# Patient Record
Sex: Female | Born: 1995 | Race: Black or African American | Hispanic: No | Marital: Married | Smoking: Never smoker
Health system: Southern US, Community
[De-identification: ages and names within clinical notes are randomized; demographics above are authoritative.]

## PROBLEM LIST (undated history)

## (undated) DIAGNOSIS — D509 Iron deficiency anemia, unspecified: Secondary | ICD-10-CM

## (undated) DIAGNOSIS — Z789 Other specified health status: Secondary | ICD-10-CM

## (undated) HISTORY — DX: Other specified health status: Z78.9

## (undated) HISTORY — DX: Iron deficiency anemia, unspecified: D50.9

---

## 2021-02-23 ENCOUNTER — Encounter: Payer: Self-pay | Admitting: Nurse Practitioner

## 2021-02-23 ENCOUNTER — Other Ambulatory Visit: Payer: Self-pay

## 2021-02-23 ENCOUNTER — Ambulatory Visit: Payer: Medicaid Other | Admitting: Nurse Practitioner

## 2021-02-23 VITALS — BP 106/60 | HR 97 | Temp 97.6°F | Ht 65.0 in | Wt 141.2 lb

## 2021-02-23 DIAGNOSIS — Z3402 Encounter for supervision of normal first pregnancy, second trimester: Secondary | ICD-10-CM | POA: Diagnosis not present

## 2021-02-23 DIAGNOSIS — O0930 Supervision of pregnancy with insufficient antenatal care, unspecified trimester: Secondary | ICD-10-CM

## 2021-02-23 DIAGNOSIS — Z3400 Encounter for supervision of normal first pregnancy, unspecified trimester: Secondary | ICD-10-CM | POA: Insufficient documentation

## 2021-02-23 DIAGNOSIS — Z23 Encounter for immunization: Secondary | ICD-10-CM | POA: Diagnosis not present

## 2021-02-23 DIAGNOSIS — O99012 Anemia complicating pregnancy, second trimester: Secondary | ICD-10-CM

## 2021-02-23 DIAGNOSIS — B379 Candidiasis, unspecified: Secondary | ICD-10-CM

## 2021-02-23 DIAGNOSIS — O0932 Supervision of pregnancy with insufficient antenatal care, second trimester: Secondary | ICD-10-CM

## 2021-02-23 DIAGNOSIS — R011 Cardiac murmur, unspecified: Secondary | ICD-10-CM

## 2021-02-23 DIAGNOSIS — D649 Anemia, unspecified: Secondary | ICD-10-CM

## 2021-02-23 HISTORY — DX: Anemia, unspecified: D64.9

## 2021-02-23 HISTORY — DX: Cardiac murmur, unspecified: R01.1

## 2021-02-23 LAB — URINALYSIS
Bilirubin, UA: NEGATIVE
Glucose, UA: NEGATIVE
Ketones, UA: NEGATIVE
Nitrite, UA: NEGATIVE
Protein,UA: NEGATIVE
RBC, UA: NEGATIVE
Specific Gravity, UA: 1.025 (ref 1.005–1.030)
Urobilinogen, Ur: 1 mg/dL (ref 0.2–1.0)
pH, UA: 6 (ref 5.0–7.5)

## 2021-02-23 LAB — WET PREP FOR TRICH, YEAST, CLUE: Trichomonas Exam: NEGATIVE

## 2021-02-23 LAB — HEMOGLOBIN, FINGERSTICK: Hemoglobin: 5.5 g/dL — CL (ref 11.1–15.9)

## 2021-02-23 MED ORDER — CLOTRIMAZOLE 1 % VA CREA: 1.0000 | TOPICAL_CREAM | Freq: Every day | VAGINAL | 0 refills | Status: AC

## 2021-02-23 MED ORDER — PRENATAL VITAMIN 27-0.8 MG PO TABS: 1.0000 | ORAL_TABLET | Freq: Every day | ORAL | 0 refills | Status: AC

## 2021-02-23 MED ORDER — CLOTRIMAZOLE 3 2 % VA CREA: 1.0000 | TOPICAL_CREAM | Freq: Every day | VAGINAL | 0 refills | Status: DC

## 2021-02-23 NOTE — Progress Notes (Signed)
[facility]   INITIAL PRENATAL VISIT NOTE  Subjective:  Rachel Simon is a 26 y.o. G1P0 at [redacted]w[redacted]d being seen today to start prenatal care at the [facility] Department.  She is currently monitored for the following issues for this high-risk pregnancy and has Supervision of normal first pregnancy, antepartum; Anemia; and Heart murmur on their problem list.  Patient reports no complaints.  Contractions: Not present. Vag. Bleeding: None.  Movement: Present. Denies leaking of fluid.   Indications for ASA therapy (per uptodate) One of the following: Previous pregnancy with preeclampsia, especially early onset and with an adverse outcome No Multifetal gestation No Chronic hypertension No Type 1 or 2 diabetes mellitus No Chronic kidney disease No Autoimmune disease (antiphospholipid syndrome, systemic lupus erythematosus) No  Two or more of the following: Nulliparity Yes Obesity (body mass index >30 kg/m2) No Family history of preeclampsia in mother or sister No Age ?35 years No Sociodemographic characteristics (African American race, low socioeconomic level) Yes Personal risk factors (eg, previous pregnancy with low birth weight or small for gestational age infant, previous adverse pregnancy outcome [eg, stillbirth], interval >10 years between pregnancies) No   The following portions of the patient's history were reviewed and updated as appropriate: allergies, current medications, past family history, past medical history, past social history, past surgical history and problem list. Problem list updated.  Objective:   Vitals:   02/23/21 1343  BP: 106/60  Pulse: 97  Temp: 97.6 F (36.4 C)  Weight: 141 lb 3.2 oz (64 kg)  Height: 5\' 5"  (1.651 m)    Fetal Status: Fetal Heart Rate (bpm): 150 Fundal Height: 24 cm Movement: Present  Presentation: Undeterminable   Physical Exam Vitals and nursing note reviewed.   Constitutional:      General: She is not in acute distress.    Appearance: Normal appearance. She is well-developed.  HENT:     Head: Normocephalic and atraumatic.     Right Ear: External ear normal.     Left Ear: External ear normal.     Nose: Nose normal. No congestion or rhinorrhea.     Mouth/Throat:     Lips: Pink.     Mouth: Mucous membranes are moist.     Dentition: Normal dentition. No dental caries.     Pharynx: Oropharynx is clear. Uvula midline.     Comments: Dentition: No visible signs of dental caries.  Last dental exam 1 year ago.  Eyes:     General: No scleral icterus.    Conjunctiva/sclera: Conjunctivae normal.     Pupils: Pupils are equal, round, and reactive to light.  Neck:     Thyroid: No thyroid mass or thyromegaly.  Cardiovascular:     Rate and Rhythm: Normal rate.     Pulses: Normal pulses.     Heart sounds: Murmur heard.     Comments: Extremities are warm and well perfused Grade 3, blowing, heard loudest in the left and right second intercostal space.  Pulmonary:     Effort: Pulmonary effort is normal.     Breath sounds: Normal breath sounds.  Chest:     Chest wall: No mass.  Breasts:    Tanner Score is 5.     Breasts are symmetrical.     Right: Normal. No mass, nipple discharge or skin change.     Left: Normal. No mass, nipple discharge or skin change.     Comments: Breasts:  Right: Normal. No swelling, mass, nipple discharge, skin change or tenderness.        Left: Normal. No swelling, mass, nipple discharge, skin change or tenderness.   Abdominal:     General: Abdomen is flat.     Palpations: Abdomen is soft.     Tenderness: There is no abdominal tenderness.     Comments: Gravid   Genitourinary:    General: Normal vulva.     Exam position: Lithotomy position.     Pubic Area: No rash.      Labia:        Right: No rash.        Left: No rash.      Vagina: Normal. No vaginal discharge.     Cervix: No cervical motion tenderness or  friability.     Uterus: Normal. Enlarged (Gravid 24 size). Not tender.      Adnexa: Right adnexa normal and left adnexa normal.     Rectum: Normal. No external hemorrhoid.     Comments: External genitalia/pubic area without nits, lice, edema, erythema, lesions and inguinal adenopathy. Vagina with normal mucosa and discharge. Cervix without visible lesions. Uterus firm, mobile, nt, no masses, no CMT, no adnexal tenderness or fullness. Yeast noted.   Musculoskeletal:     Cervical back: Full passive range of motion without pain, normal range of motion and neck supple.     Right lower leg: No edema.     Left lower leg: No edema.  Lymphadenopathy:     Cervical: No cervical adenopathy.     Upper Body:     Right upper body: No axillary adenopathy.     Left upper body: No axillary adenopathy.  Skin:    General: Skin is warm and dry.  Neurological:     Mental Status: She is alert and oriented to person, place, and time.  Psychiatric:        Attention and Perception: Attention normal.        Mood and Affect: Mood normal.        Speech: Speech normal.        Behavior: Behavior is cooperative.    Assessment and Plan:  Pregnancy: G1P0 at [redacted]w[redacted]d  1. Supervision of normal first pregnancy, antepartum - 26 year old female in clinic today for a prenatal visit.  -Patient not currently not taking PNV.  Will provide PNV today for patient.  -- Dating: has sure LMP, dating [place] not indicated. - Genetic screening: exceeded gestation age for genetic screening.  - Pregnancy sx: Denies N/V- counseling given if it developed  -  Reviewed safety of routine dental care in pregnancy.  - Has support system that is involved  - Routine labs to determine anemia status given report  - Vaccinations: Flu vaccine given today.  Patient reports never having a COVID vaccine.  - Has two minor risk factors for preeclampsia. Due to gestation age, no benefits in starting ASA at this time.   -PHQ-9-4  - Urine Culture -  Chlamydia/GC NAA, Confirmation - Lead, blood (adult age 53 yrs or greater) - HCV Ab w Reflex to Quant PCR - Hgb Fractionation Cascade - HIV-1/HIV-2 Qualitative RNA - Prenatal profile without Varicella or Rubella - QuantiFERON-TB Gold Plus - Prenatal Vit-Fe Fumarate-FA (PRENATAL VITAMIN) 27-0.8 MG TABS; Take 1 tablet by mouth daily at 6 (six) AM.  Dispense: 100 tablet; Refill: 0 - WET PREP FOR TRICH, YEAST, CLUE - IGP, rfx Aptima HPV ASCU - Hemoglobin, venipuncture - Urinalysis (Urine Dip) -  AMB Referral to [facility] - Ambulatory referral to Hematology / Oncology  2. Anemia, unspecified type -Hemoglobin obtained today= 5.5.  Referral made to hematology oncology today. Encouraged iron rich foods.  - Fe+CBC/D/Plt+TIBC+Fer+Retic  3. Heart murmur -Heart murmur detected on physical exam.   -Grade 3, blowing, heard loudest in the left and right second intercostal space.  -Referral made to [facility].    4. Late prenatal care -Late entry to care at 24 weeks.     5. Yeast detected -Wet mount reviewed.  Please treat patient for yeast.  - clotrimazole (CLOTRIMAZOLE-7) 1 % vaginal cream; Place 1 Applicatorful vaginally at bedtime for 7 days.  Dispense: 45 g; Refill: 0  Language line used for the provider portion of the visit.  ID # [id number].  Discussed overview of care and coordination with inpatient delivery practices including [facility], [facility], [facility] and [facility].      Term labor symptoms and general obstetric precautions including but not limited to vaginal bleeding, contractions, leaking of fluid and fetal movement were reviewed in detail with the patient.  Please refer to After Visit Summary for other counseling recommendations.      Return in about 4 weeks (around 03/23/2021) for Routine prenatal care.   Gregary Cromer, FNP

## 2021-02-23 NOTE — Progress Notes (Signed)
Presents for initiation of prenatal care. Denies receiving any prenatal care thus far in her pregnancy and has not been taking PNV.PNV dispensed. Client was born in the [place] and in the [place] x6 years. Jossie Ng, RN Client's husband signed Tour manager form in the event he needs to assist in interpretation. Jossie Ng, RN Dr. Alvester Morin and Glenna Fellows FNP notified of [facility] lab hgb = 5.5 and repeat of 5.7. Per Ms. White, no oral iron supplement at this time. [facility] e-referral done. Wet prep with moderate yeast and treated per standing order. Urine dip reviewed and with 3= leukocytes. Urine culture ordered as part of new OB labs. Jossie Ng, RN Client provided copy of [facility] map with [employer] [facility] [facility] ([place]) noted. Address for Cardiology at [facility] in [place] provided to client. Also provided [facility]. Per client, they have no car and are unable to get to appts (walked to [facility] appt today). Per Dr. Alvester Morin, contact R. Marlan Palau MSW-OBCM for transpo assistance as client has presumptive Medicaid. Per client, entered [place] 6 years ago as refugees. Counseled to contact DSS to determine if eligible for Medicaid benefits. Jossie Ng, RN

## 2021-02-24 ENCOUNTER — Encounter: Payer: Self-pay | Admitting: *Deleted

## 2021-02-24 LAB — FE+CBC/D/PLT+TIBC+FER+RETIC
Basophils Absolute: 0 10*3/uL (ref 0.0–0.2)
Basos: 0 %
EOS (ABSOLUTE): 0 10*3/uL (ref 0.0–0.4)
Eos: 0 %
Ferritin: 7 ng/mL — ABNORMAL LOW (ref 15–150)
Hematocrit: 19.1 % — ABNORMAL LOW (ref 34.0–46.6)
Hemoglobin: 5.7 g/dL — CL (ref 11.1–15.9)
Immature Grans (Abs): 0.1 10*3/uL (ref 0.0–0.1)
Immature Granulocytes: 1 %
Iron Saturation: 3 % — CL (ref 15–55)
Iron: 18 ug/dL — ABNORMAL LOW (ref 27–159)
Lymphocytes Absolute: 1.1 10*3/uL (ref 0.7–3.1)
Lymphs: 13 %
MCH: 20.1 pg — ABNORMAL LOW (ref 26.6–33.0)
MCHC: 29.8 g/dL — ABNORMAL LOW (ref 31.5–35.7)
MCV: 68 fL — ABNORMAL LOW (ref 79–97)
Monocytes Absolute: 0.5 10*3/uL (ref 0.1–0.9)
Monocytes: 6 %
Neutrophils Absolute: 6.7 10*3/uL (ref 1.4–7.0)
Neutrophils: 80 %
Platelets: 300 10*3/uL (ref 150–450)
RBC: 2.83 x10E6/uL — ABNORMAL LOW (ref 3.77–5.28)
RDW: 17.6 % — ABNORMAL HIGH (ref 11.7–15.4)
Retic Ct Pct: 2 % (ref 0.6–2.6)
Total Iron Binding Capacity: 543 ug/dL — ABNORMAL HIGH (ref 250–450)
UIBC: 525 ug/dL — ABNORMAL HIGH (ref 131–425)
WBC: 8.5 10*3/uL (ref 3.4–10.8)

## 2021-02-24 LAB — CBC/D/PLT+RPR+RH+ABO+AB SCR
Antibody Screen: NEGATIVE
Hepatitis B Surface Ag: NEGATIVE
RPR Ser Ql: NONREACTIVE
Rh Factor: POSITIVE

## 2021-02-24 LAB — HCV AB W REFLEX TO QUANT PCR: HCV Ab: <0.1 s/co ratio (ref 0.0–0.9)

## 2021-02-24 LAB — LEAD, BLOOD (ADULT >= 16 YRS): Lead-Whole Blood: <1 ug/dL (ref 0.0–3.4)

## 2021-02-24 LAB — HCV INTERPRETATION

## 2021-02-26 LAB — CHLAMYDIA/GC NAA, CONFIRMATION
Chlamydia trachomatis, NAA: NEGATIVE
Neisseria gonorrhoeae, NAA: NEGATIVE

## 2021-02-27 ENCOUNTER — Other Ambulatory Visit: Payer: Self-pay | Admitting: Nurse Practitioner

## 2021-02-27 DIAGNOSIS — Z3689 Encounter for other specified antenatal screening: Secondary | ICD-10-CM

## 2021-02-27 DIAGNOSIS — O99019 Anemia complicating pregnancy, unspecified trimester: Secondary | ICD-10-CM | POA: Insufficient documentation

## 2021-02-27 LAB — IGP, RFX APTIMA HPV ASCU: PAP Smear Comment: 0

## 2021-02-27 NOTE — Progress Notes (Deleted)
°  [facility]  [phone] [phone]  ID: Rachel Simon OB: 17-Nov-1995  MR#: [id number]  SVX#:[id number]  Patient Care Team: Pcp, No as PCP - General Orlie Dakin Tollie Pizza, MD as Consulting Physician (Hematology)  CHIEF COMPLAINT: Anemia affecting pregnancy.  INTERVAL HISTORY: ***  REVIEW OF SYSTEMS:   ROS  As per HPI. Otherwise, a complete review of systems is negative.  PAST MEDICAL HISTORY: Past Medical History:  Diagnosis Date   Anemia 02/23/2021   Heart murmur 02/23/2021   IDA (iron deficiency anemia)     PAST SURGICAL HISTORY: No past surgical history on file.  FAMILY HISTORY: Family History  Problem Relation Age of Onset   GI problems Father     ADVANCED DIRECTIVES (Y/N):  N  HEALTH MAINTENANCE: Social History   Tobacco Use   Smoking status: Never    Passive exposure: Never   Smokeless tobacco: Never  Vaping Use   Vaping Use: Never used  Substance Use Topics   Alcohol use: Never   Drug use: Never     Colonoscopy:  PAP:  Bone density:  Lipid panel:  No Known Allergies  Current Outpatient Medications  Medication Sig Dispense Refill   clotrimazole (CLOTRIMAZOLE-7) 1 % vaginal cream Place 1 Applicatorful vaginally at bedtime for 7 days. 45 g 0   Prenatal Vit-Fe Fumarate-FA (PRENATAL VITAMIN) 27-0.8 MG TABS Take 1 tablet by mouth daily at 6 (six) AM. 100 tablet 0   No current facility-administered medications for this visit.    OBJECTIVE: There were no vitals filed for this visit.   There is no height or weight on file to calculate BMI.    ECOG FS:{CHL ONC [id number]  General: Well-developed, well-nourished, no acute distress. Eyes: Pink conjunctiva, anicteric sclera. HEENT: Normocephalic, moist mucous membranes. Lungs: No audible wheezing or coughing. Heart: Regular rate and rhythm. Abdomen: Soft, nontender, no obvious distention. Musculoskeletal: No edema, cyanosis, or clubbing. Neuro:  Alert, answering all questions appropriately. Cranial nerves grossly intact. Skin: No rashes or petechiae noted. Psych: Normal affect. Lymphatics: No cervical, calvicular, axillary or inguinal LAD.   LAB RESULTS:  No results found for: NA, K, CL, CO2, GLUCOSE, BUN, CREATININE, CALCIUM, PROT, ALBUMIN, AST, ALT, ALKPHOS, BILITOT, GFRNONAA, GFRAA  Lab Results  Component Value Date   WBC 8.5 02/23/2021   NEUTROABS 6.7 02/23/2021   HGB 5.7 (LL) 02/23/2021   HCT 19.1 (L) 02/23/2021   MCV 68 (L) 02/23/2021   PLT 300 02/23/2021     STUDIES: No results found.  ASSESSMENT: Anemia affecting pregnancy.  PLAN:    Anemia affecting pregnancy:   Patient expressed understanding and was in agreement with this plan. She also understands that She can call clinic at any time with any questions, concerns, or complaints.    Cancer Staging  No matching staging information was found for the patient.  Jeralyn Ruths, MD   02/27/2021 8:59 AM

## 2021-02-28 ENCOUNTER — Inpatient Hospital Stay: Payer: Medicaid Other

## 2021-02-28 ENCOUNTER — Encounter: Payer: Self-pay | Admitting: Cardiology

## 2021-02-28 ENCOUNTER — Inpatient Hospital Stay: Payer: Medicaid Other | Admitting: Oncology

## 2021-02-28 ENCOUNTER — Telehealth: Payer: Self-pay | Admitting: Licensed Clinical Social Worker

## 2021-02-28 ENCOUNTER — Telehealth: Payer: Self-pay

## 2021-02-28 ENCOUNTER — Ambulatory Visit (INDEPENDENT_AMBULATORY_CARE_PROVIDER_SITE_OTHER): Payer: Medicaid Other | Admitting: Cardiology

## 2021-02-28 ENCOUNTER — Other Ambulatory Visit: Payer: Self-pay

## 2021-02-28 VITALS — BP 118/60 | HR 74 | Ht 65.0 in | Wt 146.8 lb

## 2021-02-28 DIAGNOSIS — I38 Endocarditis, valve unspecified: Secondary | ICD-10-CM | POA: Insufficient documentation

## 2021-02-28 DIAGNOSIS — Z719 Counseling, unspecified: Secondary | ICD-10-CM | POA: Diagnosis not present

## 2021-02-28 DIAGNOSIS — O99019 Anemia complicating pregnancy, unspecified trimester: Secondary | ICD-10-CM

## 2021-02-28 LAB — HGB FRACTIONATION CASCADE
Hgb A2: 1.9 % (ref 1.8–3.2)
Hgb A: 98.1 % (ref 96.4–98.8)
Hgb F: 0 % (ref 0.0–2.0)
Hgb S: 0 %

## 2021-02-28 LAB — QUANTIFERON-TB GOLD PLUS
QuantiFERON Mitogen Value: 0.86 IU/mL
QuantiFERON Nil Value: 0.02 IU/mL
QuantiFERON TB1 Ag Value: 0.02 IU/mL
QuantiFERON TB2 Ag Value: 0.02 IU/mL
QuantiFERON-TB Gold Plus: NEGATIVE

## 2021-02-28 LAB — HIV-1/HIV-2 QUALITATIVE RNA
HIV-1 RNA, Qualitative: NONREACTIVE
HIV-2 RNA, Qualitative: NONREACTIVE

## 2021-02-28 NOTE — Telephone Encounter (Signed)
Client kept cardiology appt this am in [place].   [facility] at [facility] for evaluation of anemia due to mix up with transportation. Per Efraim Kaufmann at [facility], client has new appt on 03/02/2021 at 1000 and Zenaida Niece will pick her up (arranged by Cancer Center and no charge to client) at 0900. Client must be outside waiting for her transportation.  Client needs [facility] RV appt at [facility] and scheduled for 03/23/2021 with arrival time of 1100 (will try to be here by 1045).  Client notified of [facility] ([place]) [place] appt on 03/30/2021 with arrival time of 1245 and aware RN will contact R. Marlan Palau MSW, OBCM for assistance with transportation.  Client declined offer of Swahili interpreter and able to converse with RN in [place]. Client verbalized all of above appt dates and times and able to correctly verbalize to be waiting outside at 9:00 for ride to [facility] appt. Jossie Ng, RN

## 2021-02-28 NOTE — Telephone Encounter (Signed)
TC to patient with [employer] with [place] ID# [phone]. Patient has [facility] U/S appointment on 03/30/21 at 1:00. Patient also needs to schedule next MH RV. LM with number to call.Burt Knack, RN

## 2021-02-28 NOTE — Telephone Encounter (Addendum)
LCSW assisted pt with transportation home today through [place] as having challenges w/ getting connected with her Marjean Donna ride due to language barrier and office layout. LCSW also updated [facility] hematology-oncology office [facility]) that she will be delayed coming to appt today due to challenges w/ transportation home.   Octavio Graves, MSW, LCSW Clinical Social Worker II [facility] Navigation  [phone]- work cell phone (preferred) [phone]- desk phone

## 2021-02-28 NOTE — Patient Instructions (Addendum)
Medication Instructions:  Your physician recommends that you continue on your current medications as directed. Please refer to the Current Medication list given to you today.  *If you need a refill on your cardiac medications before your next appointment, please call your pharmacy*   Lab Work: None If you have labs (blood work) drawn today and your tests are completely normal, you will receive your results only by: MyChart Message (if you have MyChart) OR A paper copy in the mail If you have any lab test that is abnormal or we need to change your treatment, we will call you to review the results.   Testing/Procedures: Your physician has requested that you have an echocardiogram. Echocardiography is a painless test that uses sound waves to create images of your heart. It provides your doctor with information about the size and shape of your heart and how well your hearts chambers and valves are working. This procedure takes approximately one hour. There are no restrictions for this procedure.    Follow-Up: At [facility], you and your health needs are our priority.  As part of our continuing mission to provide you with exceptional heart care, we have created designated Provider Care Teams.  These Care Teams include your primary Cardiologist (physician) and Advanced Practice Providers (APPs -  Physician Assistants and Nurse Practitioners) who all work together to provide you with the care you need, when you need it.  We recommend signing up for the patient portal called "MyChart".  Sign up information is provided on this After Visit Summary.  MyChart is used to connect with patients for Virtual Visits (Telemedicine).  Patients are able to view lab/test results, encounter notes, upcoming appointments, etc.  Non-urgent messages can be sent to your provider as well.   To learn more about what you can do with MyChart, go to [email or web address].    Your next appointment:   8 week(s)  The  format for your next appointment:   In Person  Provider:   Thomasene Ripple, DO [facility] [address] [place], [place] [address]     Other Instructions

## 2021-02-28 NOTE — Progress Notes (Signed)
Cardio-Obstetrics Clinic  New Evaluation  Date:  02/28/2021   ID:  Rachel Simon, Rachel Simon, 1997, MRN [id number]  PCP:  Pcp, No   [facility] Providers Cardiologist:  Thomasene Ripple, DO  Electrophysiologist:  None       Referring MD: Federico Flake,*   Chief Complaint: " I am not sure my doctor ask me to come see you"  History of Present Illness:    Rachel Simon is a 26 y.o. female [G1P0] who is being seen today for the evaluation of heart murmur at the request of Federico Flake,*.    Prior CV Studies Reviewed: The following studies were reviewed today:   Past Medical History:  Diagnosis Date   Anemia 02/23/2021   Heart murmur 02/23/2021   IDA (iron deficiency anemia)     No past surgical history on file.    OB History     Gravida  1   Rachel      Term      Preterm      AB      Living         SAB      IAB      Ectopic      Multiple      Live Births                  Current Medications: Current Meds  Medication Sig   clotrimazole (CLOTRIMAZOLE-7) 1 % vaginal cream Place 1 Applicatorful vaginally at bedtime for 7 days.   Prenatal Vit-Fe Fumarate-FA (PRENATAL VITAMIN) 27-0.8 MG TABS Take 1 tablet by mouth daily at 6 (six) AM.     Allergies:   Patient has no known allergies.   Social History   Socioeconomic History   Marital status: Married    Spouse name: Mfaume Lumona   Number of children: 0   Years of education: 12   Highest education level: High school graduate  Occupational History   Occupation: homemaker  Tobacco Use   Smoking status: Never    Passive exposure: Never   Smokeless tobacco: Never  Vaping Use   Vaping Use: Never used  Substance and Sexual Activity   Alcohol use: Never   Drug use: Never   Sexual activity: Yes    Birth control/protection: None  Other Topics Concern   Not on file  Social History Narrative   Lives with spouse.   Social Determinants of Health   Financial  Resource Strain: Medium Risk   Difficulty of Paying Living Expenses: Somewhat hard  Food Insecurity: Geophysicist/field seismologist Present   Worried About Programme researcher, broadcasting/film/video in the Last Year: Sometimes true   Barista in the Last Year: Sometimes true  Transportation Needs: Unmet Transportation Needs   Lack of Transportation (Medical): Yes   Lack of Transportation (Non-Medical): Yes  Physical Activity: Not on file  Stress: Not on file  Social Connections: Not on file      Family History  Problem Relation Age of Onset   GI problems Father       ROS:   Please see the history of present illness.     All other systems reviewed and are negative.   Labs/EKG Reviewed:    EKG:   EKG was ordered today.  The ekg ordered today demonstrates sinus rhythm, heart rate 74 bpm.  Recent Labs: 02/23/2021: Hemoglobin 5.7; Platelets 300   Recent Lipid Panel No results found for: CHOL, TRIG, HDL, CHOLHDL, LDLCALC, LDLDIRECT  Physical Exam:    VS:  BP 118/60 (BP Location: Left Arm, Patient Position: Sitting)    Pulse 74    Ht 5\' 5"  (1.651 m)    Wt 146 lb 12.8 oz (66.6 kg)    LMP Simon/15/2022 (Exact Date)    SpO2 99%    BMI 24.43 kg/m     Wt Readings from Last 3 Encounters:  02/28/21 146 lb 12.8 oz (66.6 kg)  02/23/21 141 lb 3.2 oz (64 kg)     GEN:  Well nourished, well developed in no acute distress HEENT: Normal NECK: No JVD; No carotid bruits LYMPHATICS: No lymphadenopathy CARDIAC: RRR, 2/6 diastolic  murmurs, rubs, gallops RESPIRATORY:  Clear to auscultation without rales, wheezing or rhonchi  ABDOMEN: Soft, non-tender, non-distended MUSCULOSKELETAL:  No edema; No deformity  SKIN: Warm and dry NEUROLOGIC:  Alert and oriented x 3 PSYCHIATRIC:  Normal affect    Risk Assessment/Risk Calculators:     CARPREG II Risk Prediction Index Score:  1.  The patient's risk for a primary cardiac event is 5%.            ASSESSMENT & PLAN:    Diastolic heart murmur  Diastolic murmurs were  not part of physiologic pregnancy murmurs.  Therefore with the evidence of diastolic murmur we will get an echocardiogram to rule out any valvular abnormalities.   Patient Instructions  Medication Instructions:  Your physician recommends that you continue on your current medications as directed. Please refer to the Current Medication list given to you today.  *If you need a refill on your cardiac medications before your next appointment, please call your pharmacy*   Lab Work: None If you have labs (blood work) drawn today and your tests are completely normal, you will receive your results only by: MyChart Message (if you have MyChart) OR A paper copy in the mail If you have any lab test that is abnormal or we need to change your treatment, we will call you to review the results.   Testing/Procedures: Your physician has requested that you have an echocardiogram. Echocardiography is a painless test that uses sound waves to create images of your heart. It provides your doctor with information about the size and shape of your heart and how well your hearts chambers and valves are working. This procedure takes approximately one hour. There are no restrictions for this procedure.    Follow-Up: At [facility], you and your health needs are our priority.  As part of our continuing mission to provide you with exceptional heart care, we have created designated Provider Care Teams.  These Care Teams include your primary Cardiologist (physician) and Advanced Practice Providers (APPs -  Physician Assistants and Nurse Practitioners) who all work together to provide you with the care you need, when you need it.  We recommend signing up for the patient portal called "MyChart".  Sign up information is provided on this After Visit Summary.  MyChart is used to connect with patients for Virtual Visits (Telemedicine).  Patients are able to view lab/test results, encounter notes, upcoming appointments, etc.   Non-urgent messages can be sent to your provider as well.   To learn more about what you can do with MyChart, go to [facility].    Your next appointment:   8 week(s)  The format for your next appointment:   In Person  Provider:   [email or web address], DO [facility] [address], [place], [place]     Other Instructions  Dispo:  No follow-ups on file.   Medication Adjustments/Labs and Tests Ordered: Current medicines are reviewed at length with the patient today.  Concerns regarding medicines are outlined above.  Tests Ordered: Orders Placed This Encounter  Procedures   EKG 12-Lead   ECHOCARDIOGRAM COMPLETE   Medication Changes: No orders of the defined types were placed in this encounter.

## 2021-02-28 NOTE — Telephone Encounter (Signed)
Opened in error. Izumi Mixon, RN  

## 2021-03-01 LAB — URINE CULTURE

## 2021-03-01 NOTE — Telephone Encounter (Signed)
Client's [facility] appt on 03/23/2021 changed to same time on 03/22/2021 due to agency training. R. Marlan Palau MSW, OBCM to call client regarding Medicaid and will notify her of change in appt. Per Ms. Marlan Palau, client has full Medicaid in [facility] and Ms. Marlan Palau is working to have that changed to [facility]. Jossie Ng, RN

## 2021-03-02 ENCOUNTER — Inpatient Hospital Stay: Payer: Medicaid Other

## 2021-03-02 ENCOUNTER — Other Ambulatory Visit: Payer: Self-pay

## 2021-03-02 ENCOUNTER — Inpatient Hospital Stay: Payer: Medicaid Other | Attending: Oncology | Admitting: Oncology

## 2021-03-02 ENCOUNTER — Encounter: Payer: Self-pay | Admitting: Oncology

## 2021-03-02 ENCOUNTER — Other Ambulatory Visit: Payer: Self-pay | Admitting: Oncology

## 2021-03-02 VITALS — BP 104/71 | HR 86 | Temp 98.7°F | Resp 20 | Wt 150.5 lb

## 2021-03-02 DIAGNOSIS — Z8379 Family history of other diseases of the digestive system: Secondary | ICD-10-CM | POA: Diagnosis not present

## 2021-03-02 DIAGNOSIS — D509 Iron deficiency anemia, unspecified: Secondary | ICD-10-CM | POA: Diagnosis not present

## 2021-03-02 DIAGNOSIS — R5383 Other fatigue: Secondary | ICD-10-CM

## 2021-03-02 DIAGNOSIS — D508 Other iron deficiency anemias: Secondary | ICD-10-CM

## 2021-03-02 DIAGNOSIS — O99012 Anemia complicating pregnancy, second trimester: Secondary | ICD-10-CM

## 2021-03-02 DIAGNOSIS — Z3A00 Weeks of gestation of pregnancy not specified: Secondary | ICD-10-CM | POA: Diagnosis not present

## 2021-03-02 DIAGNOSIS — Z596 Low income: Secondary | ICD-10-CM

## 2021-03-02 DIAGNOSIS — Z79899 Other long term (current) drug therapy: Secondary | ICD-10-CM | POA: Diagnosis not present

## 2021-03-02 DIAGNOSIS — D649 Anemia, unspecified: Secondary | ICD-10-CM

## 2021-03-02 LAB — CBC WITH DIFFERENTIAL/PLATELET
Abs Immature Granulocytes: 0.3 10*3/uL — ABNORMAL HIGH (ref 0.00–0.07)
Basophils Absolute: 0 10*3/uL (ref 0.0–0.1)
Basophils Relative: 0 %
Eosinophils Absolute: 0.1 10*3/uL (ref 0.0–0.5)
Eosinophils Relative: 1 %
HCT: 20.6 % — ABNORMAL LOW (ref 36.0–46.0)
Hemoglobin: 5.6 g/dL — ABNORMAL LOW (ref 12.0–15.0)
Immature Granulocytes: 4 %
Lymphocytes Relative: 19 %
Lymphs Abs: 1.6 10*3/uL (ref 0.7–4.0)
MCH: 19.8 pg — ABNORMAL LOW (ref 26.0–34.0)
MCHC: 27.2 g/dL — ABNORMAL LOW (ref 30.0–36.0)
MCV: 72.8 fL — ABNORMAL LOW (ref 80.0–100.0)
Monocytes Absolute: 0.6 10*3/uL (ref 0.1–1.0)
Monocytes Relative: 7 %
Neutro Abs: 5.8 10*3/uL (ref 1.7–7.7)
Neutrophils Relative %: 69 %
Platelets: 245 10*3/uL (ref 150–400)
RBC: 2.83 MIL/uL — ABNORMAL LOW (ref 3.87–5.11)
RDW: 20.7 % — ABNORMAL HIGH (ref 11.5–15.5)
WBC: 8.4 10*3/uL (ref 4.0–10.5)
nRBC: 0.7 % — ABNORMAL HIGH (ref 0.0–0.2)

## 2021-03-02 LAB — BASIC METABOLIC PANEL
Anion gap: 8 (ref 5–15)
BUN: 8 mg/dL (ref 6–20)
CO2: 24 mmol/L (ref 22–32)
Calcium: 8.7 mg/dL — ABNORMAL LOW (ref 8.9–10.3)
Chloride: 103 mmol/L (ref 98–111)
Creatinine, Ser: 0.38 mg/dL — ABNORMAL LOW (ref 0.44–1.00)
GFR, Estimated: >60 mL/min (ref >60)
Glucose, Bld: 82 mg/dL (ref 70–99)
Potassium: 3.8 mmol/L (ref 3.5–5.1)
Sodium: 135 mmol/L (ref 135–145)

## 2021-03-02 LAB — RETIC PANEL
Immature Retic Fract: 38.1 % — ABNORMAL HIGH (ref 2.3–15.9)
RBC.: 2.85 MIL/uL — ABNORMAL LOW (ref 3.87–5.11)
Retic Count, Absolute: 107.4 10*3/uL (ref 19.0–186.0)
Retic Ct Pct: 3.8 % — ABNORMAL HIGH (ref 0.4–3.1)
Reticulocyte Hemoglobin: 20.2 pg — ABNORMAL LOW (ref >27.9)

## 2021-03-02 LAB — HEPATIC FUNCTION PANEL
ALT: 9 U/L (ref 0–44)
AST: 20 U/L (ref 15–41)
Albumin: 3.1 g/dL — ABNORMAL LOW (ref 3.5–5.0)
Alkaline Phosphatase: 60 U/L (ref 38–126)
Bilirubin, Direct: <0.1 mg/dL (ref 0.0–0.2)
Total Bilirubin: 0.2 mg/dL — ABNORMAL LOW (ref 0.3–1.2)
Total Protein: 6.6 g/dL (ref 6.5–8.1)

## 2021-03-02 LAB — PREPARE RBC (CROSSMATCH)

## 2021-03-02 LAB — ABO/RH: ABO/RH(D): B POS

## 2021-03-02 NOTE — Progress Notes (Addendum)
Hematology/Oncology Consult note [phone] [phone]         Patient Care Team: Pcp, No as PCP - General Thomasene Ripple, DO as PCP - Cardiology (Cardiology) Jeralyn Ruths, MD as Consulting Physician (Hematology)  REFERRING PROVIDER: Federico Flake,*  CHIEF COMPLAINTS/REASON FOR VISIT:  Evaluation of iron deficiency anemia  HISTORY OF PRESENTING ILLNESS:   Rachel Simon is a  26 y.o.  female with PMH listed below was seen in consultation at the request of  Federico Flake,*  for evaluation of iron deficiency anemia  G1, P0, history of heart murmur and recently being seen by cardiology. Patient is currently in second trimester.  Establish care with maternal health clinic at [facility] department. 02/23/2021, patient had blood work done CBC showed hemoglobin 5.7, total white count 8.5, platelet count 300, MCV 68, iron 18, iron saturation 3, iron ferritin 7.  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  HCV negative.  Blood lead within normal limits.  Patient reports feeling tired.  Denies shortness of breath, she was accompanied by her husband.  Patient denies having heavy menstrual period previously.  No dietary restrictions.  Denies any black tarry stool or blood in the stool.  Review of Systems  Constitutional:  Positive for fatigue. Negative for appetite change, chills and fever.  HENT:   Negative for hearing loss and voice change.   Eyes:  Negative for eye problems.  Respiratory:  Negative for chest tightness and cough.   Cardiovascular:  Negative for chest pain.  Gastrointestinal:  Negative for abdominal distention, abdominal pain and blood in stool.  Endocrine: Negative for hot flashes.  Genitourinary:  Negative for difficulty urinating and frequency.   Musculoskeletal:  Negative for arthralgias.  Skin:  Negative for itching and rash.  Neurological:  Negative for  extremity weakness.  Hematological:  Negative for adenopathy.  Psychiatric/Behavioral:  Negative for confusion.    MEDICAL HISTORY:  Past Medical History:  Diagnosis Date   Anemia 02/23/2021   Heart murmur 02/23/2021   IDA (iron deficiency anemia)     SURGICAL HISTORY: History reviewed. No pertinent surgical history.  SOCIAL HISTORY: Social History   Socioeconomic History   Marital status: Married    Spouse name: Mfaume Lumona   Number of children: 0   Years of education: 12   Highest education level: High school graduate  Occupational History   Occupation: homemaker  Tobacco Use   Smoking status: Never    Passive exposure: Never   Smokeless tobacco: Never  Vaping Use   Vaping Use: Never used  Substance and Sexual Activity   Alcohol use: Never   Drug use: Never   Sexual activity: Yes    Birth control/protection: None  Other Topics Concern   Not on file  Social History Narrative   Lives with spouse.   Social Determinants of Health   Financial Resource Strain: Medium Risk   Difficulty of Paying Living Expenses: Somewhat hard  Food Insecurity: Food Insecurity Present   Worried About Programme researcher, broadcasting/film/video in the Last Year: Sometimes true   Barista in the Last Year: Sometimes true  Transportation Needs: Unmet Transportation Needs   Lack of Transportation (Medical): Yes   Lack of Transportation (Non-Medical): Yes  Physical Activity: Not on file  Stress: Not on file  Social Connections: Not on file  Intimate Partner Violence: Not At Risk   Fear of Current or Ex-Partner: No   Emotionally  Abused: No   Physically Abused: No   Sexually Abused: No    FAMILY HISTORY: Family History  Problem Relation Age of Onset   GI problems Father     ALLERGIES:  has No Known Allergies.  MEDICATIONS:  Current Outpatient Medications  Medication Sig Dispense Refill   clotrimazole (CLOTRIMAZOLE-7) 1 % vaginal cream Place 1 Applicatorful vaginally at bedtime for 7 days.  45 g 0   Prenatal Vit-Fe Fumarate-FA (PRENATAL VITAMIN) 27-0.8 MG TABS Take 1 tablet by mouth daily at 6 (six) AM. 100 tablet 0   No current facility-administered medications for this visit.     PHYSICAL EXAMINATION: ECOG PERFORMANCE STATUS: 1 - Symptomatic but completely ambulatory Vitals:   03/02/21 1014  BP: 104/71  Pulse: 86  Resp: 20  Temp: 98.7 F (37.1 C)  SpO2: 100%   Filed Weights   03/02/21 1014  Weight: 150 lb 8 oz (68.3 kg)    Physical Exam Constitutional:      General: She is not in acute distress. HENT:     Head: Normocephalic and atraumatic.  Eyes:     General: No scleral icterus. Cardiovascular:     Rate and Rhythm: Normal rate and regular rhythm.     Heart sounds: Murmur heard.  Pulmonary:     Effort: Pulmonary effort is normal. No respiratory distress.     Breath sounds: No wheezing.  Abdominal:     Comments: Gravid uterus  Musculoskeletal:        General: No deformity. Normal range of motion.     Cervical back: Normal range of motion and neck supple.  Skin:    General: Skin is warm and dry.     Coloration: Skin is pale.     Findings: No erythema or rash.  Neurological:     Mental Status: She is alert and oriented to person, place, and time. Mental status is at baseline.     Cranial Nerves: No cranial nerve deficit.     Coordination: Coordination normal.  Psychiatric:        Mood and Affect: Mood normal.    LABORATORY DATA:  I have reviewed the data as listed Lab Results  Component Value Date   WBC 8.4 03/02/2021   HGB 5.6 (L) 03/02/2021   HCT 20.6 (L) 03/02/2021   MCV 72.8 (L) 03/02/2021   PLT 245 03/02/2021   Recent Labs    03/02/21 1118  NA 135  K 3.8  CL 103  CO2 24  GLUCOSE 82  BUN 8  CREATININE 0.38*  CALCIUM 8.7*  GFRNONAA >60  PROT 6.6  ALBUMIN 3.1*  AST 20  ALT 9  ALKPHOS 60  BILITOT 0.2*  BILIDIR <0.1  IBILI NOT CALCULATED   Iron/TIBC/Ferritin/ %Sat    Component Value Date/Time   IRON 18 (L) 02/23/2021  1600   TIBC 543 (H) 02/23/2021 1600   FERRITIN 7 (L) 02/23/2021 1600   IRONPCTSAT 3 (LL) 02/23/2021 1600      RADIOGRAPHIC STUDIES: I have personally reviewed the radiological images as listed and agreed with the findings in the report. No results found.    ASSESSMENT & PLAN:  1. Anemia during pregnancy in second trimester   2. Other iron deficiency anemia    Labs reviewed and discussed with patient..  Consistent with severe iron deficiency anemia during pregnancy. Repeat blood work today showed hemoglobin of 5.6.  Reticulocyte count of 20.2, normal total bilirubin. Rationale and side effects of blood transfusion were reviewed.  Patient agrees.Patient will  proceed with 2 units of PRBC transfusion tomorrow. Starting next week, will arrange patient to get IV Venofer weekly x4. Plan IV iron with Venofer 200mg  weekly x 4 doses. Risks of infusion reactions including anaphylactic reactions were discussed with patient. Other side effects include but not limited to high blood pressure, headache,wheezing, SOB, skin rash and itchiness, weight gain, leg swelling, etc.  IV Venofer is category B medication during pregnancy.  Benefits overweight the risks. Patient voices understanding and is willing to proceed.   Orders Placed This Encounter  Procedures   CBC with Differential/Platelet    Standing Status:   Future    Number of Occurrences:   1    Standing Expiration Date:   03/02/2022   Hepatic function panel    Standing Status:   Future    Number of Occurrences:   1    Standing Expiration Date:   03/02/2022   Basic metabolic panel    Standing Status:   Future    Number of Occurrences:   1    Standing Expiration Date:   03/02/2022   Retic Panel    Standing Status:   Future    Number of Occurrences:   1    Standing Expiration Date:   03/02/2022   ABO/Rh    Standing Status:   Future    Number of Occurrences:   1    Standing Expiration Date:   03/02/2022    All questions were answered. The  patient knows to call the clinic with any problems questions or concerns.  cc 05/01/2022,*    Return of visit: Follow-up in 6 weeks  Thank you for this kind referral and the opportunity to participate in the care of this patient. A copy of today's note is routed to referring provider   Federico Flake, MD, PhD [facility] Hematology Oncology 03/02/2021

## 2021-03-02 NOTE — Progress Notes (Signed)
Patient states no concerns at the moment. 

## 2021-03-03 ENCOUNTER — Ambulatory Visit: Payer: Medicaid Other

## 2021-03-03 ENCOUNTER — Inpatient Hospital Stay: Payer: Medicaid Other

## 2021-03-03 DIAGNOSIS — D649 Anemia, unspecified: Secondary | ICD-10-CM

## 2021-03-03 DIAGNOSIS — O99012 Anemia complicating pregnancy, second trimester: Secondary | ICD-10-CM | POA: Diagnosis not present

## 2021-03-03 DIAGNOSIS — D508 Other iron deficiency anemias: Secondary | ICD-10-CM

## 2021-03-03 MED ORDER — SODIUM CHLORIDE 0.9% IV SOLUTION
250.0000 mL | Freq: Once | INTRAVENOUS | Status: AC
  Administered 2021-03-03: 250 mL via INTRAVENOUS
  Filled 2021-03-03: qty 250

## 2021-03-03 MED ORDER — ACETAMINOPHEN 325 MG PO TABS
650.0000 mg | ORAL_TABLET | Freq: Once | ORAL | Status: AC
  Administered 2021-03-03: 650 mg via ORAL
  Filled 2021-03-03: qty 2

## 2021-03-03 MED ORDER — DIPHENHYDRAMINE HCL 25 MG PO CAPS
25.0000 mg | ORAL_CAPSULE | Freq: Once | ORAL | Status: AC
  Administered 2021-03-03: 25 mg via ORAL
  Filled 2021-03-03: qty 1

## 2021-03-04 LAB — TYPE AND SCREEN
ABO/RH(D): B POS
Antibody Screen: NEGATIVE
Unit division: 0
Unit division: 0

## 2021-03-04 LAB — BPAM RBC
Blood Product Expiration Date: 202302232359
Blood Product Expiration Date: 202302232359
ISSUE DATE / TIME: 202302100929
ISSUE DATE / TIME: 202302101136
Unit Type and Rh: 7300
Unit Type and Rh: 7300

## 2021-03-07 ENCOUNTER — Encounter (HOSPITAL_COMMUNITY): Payer: Self-pay | Admitting: Cardiology

## 2021-03-07 ENCOUNTER — Ambulatory Visit (HOSPITAL_COMMUNITY): Payer: Medicaid Other | Attending: Cardiology

## 2021-03-07 ENCOUNTER — Encounter: Payer: Self-pay | Admitting: Oncology

## 2021-03-09 ENCOUNTER — Other Ambulatory Visit: Payer: Self-pay

## 2021-03-09 ENCOUNTER — Inpatient Hospital Stay: Payer: Medicaid Other

## 2021-03-09 VITALS — BP 109/59 | HR 71 | Temp 96.4°F | Resp 18

## 2021-03-09 DIAGNOSIS — D508 Other iron deficiency anemias: Secondary | ICD-10-CM

## 2021-03-09 DIAGNOSIS — O99012 Anemia complicating pregnancy, second trimester: Secondary | ICD-10-CM | POA: Diagnosis not present

## 2021-03-09 LAB — CBC WITH DIFFERENTIAL/PLATELET
Abs Immature Granulocytes: 0.15 10*3/uL — ABNORMAL HIGH (ref 0.00–0.07)
Basophils Absolute: 0 10*3/uL (ref 0.0–0.1)
Basophils Relative: 0 %
Eosinophils Absolute: 0.1 10*3/uL (ref 0.0–0.5)
Eosinophils Relative: 1 %
HCT: 26.4 % — ABNORMAL LOW (ref 36.0–46.0)
Hemoglobin: 7.9 g/dL — ABNORMAL LOW (ref 12.0–15.0)
Immature Granulocytes: 2 %
Lymphocytes Relative: 17 %
Lymphs Abs: 1.4 10*3/uL (ref 0.7–4.0)
MCH: 22.7 pg — ABNORMAL LOW (ref 26.0–34.0)
MCHC: 29.9 g/dL — ABNORMAL LOW (ref 30.0–36.0)
MCV: 75.9 fL — ABNORMAL LOW (ref 80.0–100.0)
Monocytes Absolute: 0.7 10*3/uL (ref 0.1–1.0)
Monocytes Relative: 8 %
Neutro Abs: 6 10*3/uL (ref 1.7–7.7)
Neutrophils Relative %: 72 %
Platelets: 201 10*3/uL (ref 150–400)
RBC: 3.48 MIL/uL — ABNORMAL LOW (ref 3.87–5.11)
RDW: 23.4 % — ABNORMAL HIGH (ref 11.5–15.5)
WBC: 8.4 10*3/uL (ref 4.0–10.5)
nRBC: 0.2 % (ref 0.0–0.2)

## 2021-03-09 LAB — SAMPLE TO BLOOD BANK

## 2021-03-09 MED ORDER — IRON SUCROSE 20 MG/ML IV SOLN
200.0000 mg | Freq: Once | INTRAVENOUS | Status: AC
  Administered 2021-03-09: 200 mg via INTRAVENOUS
  Filled 2021-03-09: qty 10

## 2021-03-09 MED ORDER — SODIUM CHLORIDE 0.9 % IV SOLN
Freq: Once | INTRAVENOUS | Status: AC
  Filled 2021-03-09: qty 250

## 2021-03-09 MED ORDER — SODIUM CHLORIDE 0.9 % IV SOLN: 200.0000 mg | Freq: Once | INTRAVENOUS | Status: DC

## 2021-03-15 ENCOUNTER — Encounter: Payer: Self-pay | Admitting: Oncology

## 2021-03-16 ENCOUNTER — Inpatient Hospital Stay: Payer: Medicaid Other

## 2021-03-16 ENCOUNTER — Other Ambulatory Visit: Payer: Self-pay

## 2021-03-16 VITALS — BP 106/60 | HR 75 | Temp 98.0°F | Resp 18

## 2021-03-16 DIAGNOSIS — D508 Other iron deficiency anemias: Secondary | ICD-10-CM

## 2021-03-16 DIAGNOSIS — O99012 Anemia complicating pregnancy, second trimester: Secondary | ICD-10-CM | POA: Diagnosis not present

## 2021-03-16 MED ORDER — IRON SUCROSE 20 MG/ML IV SOLN
200.0000 mg | Freq: Once | INTRAVENOUS | Status: AC
  Administered 2021-03-16: 200 mg via INTRAVENOUS
  Filled 2021-03-16: qty 10

## 2021-03-16 MED ORDER — SODIUM CHLORIDE 0.9 % IV SOLN
Freq: Once | INTRAVENOUS | Status: AC
  Filled 2021-03-16: qty 250

## 2021-03-16 MED ORDER — SODIUM CHLORIDE 0.9 % IV SOLN: 200.0000 mg | Freq: Once | INTRAVENOUS | Status: DC

## 2021-03-16 NOTE — Progress Notes (Deleted)
Patient tolerated IV venofer today with any complications. Patient also declined the need for an interpreter today. Vital signs stable at discharge.

## 2021-03-22 ENCOUNTER — Ambulatory Visit: Payer: Medicaid Other | Admitting: Family Medicine

## 2021-03-22 ENCOUNTER — Other Ambulatory Visit: Payer: Self-pay

## 2021-03-22 ENCOUNTER — Encounter: Payer: Self-pay | Admitting: Oncology

## 2021-03-22 VITALS — BP 113/60 | HR 75 | Temp 98.0°F | Wt 149.0 lb

## 2021-03-22 DIAGNOSIS — B379 Candidiasis, unspecified: Secondary | ICD-10-CM

## 2021-03-22 DIAGNOSIS — O99012 Anemia complicating pregnancy, second trimester: Secondary | ICD-10-CM

## 2021-03-22 DIAGNOSIS — D649 Anemia, unspecified: Secondary | ICD-10-CM

## 2021-03-22 DIAGNOSIS — Z3400 Encounter for supervision of normal first pregnancy, unspecified trimester: Secondary | ICD-10-CM

## 2021-03-22 DIAGNOSIS — Z3402 Encounter for supervision of normal first pregnancy, second trimester: Secondary | ICD-10-CM

## 2021-03-22 NOTE — Progress Notes (Signed)
Counseled PAP report was negative. Client given written appt reminder for all of her scheduled hematology, [place] and follow-up cardiology appointments. Due to time, 28 week labs to be done at next appt in [facility]. Rich Number, RN ? ?

## 2021-03-23 ENCOUNTER — Inpatient Hospital Stay: Payer: Medicaid Other | Attending: Oncology

## 2021-03-23 VITALS — BP 113/63 | HR 80 | Resp 18

## 2021-03-23 DIAGNOSIS — Z79899 Other long term (current) drug therapy: Secondary | ICD-10-CM | POA: Diagnosis not present

## 2021-03-23 DIAGNOSIS — Z5941 Food insecurity: Secondary | ICD-10-CM | POA: Insufficient documentation

## 2021-03-23 DIAGNOSIS — Z596 Low income: Secondary | ICD-10-CM | POA: Diagnosis not present

## 2021-03-23 DIAGNOSIS — R5383 Other fatigue: Secondary | ICD-10-CM | POA: Insufficient documentation

## 2021-03-23 DIAGNOSIS — Z8379 Family history of other diseases of the digestive system: Secondary | ICD-10-CM | POA: Insufficient documentation

## 2021-03-23 DIAGNOSIS — Z5982 Transportation insecurity: Secondary | ICD-10-CM | POA: Insufficient documentation

## 2021-03-23 DIAGNOSIS — O99012 Anemia complicating pregnancy, second trimester: Secondary | ICD-10-CM | POA: Diagnosis not present

## 2021-03-23 DIAGNOSIS — D508 Other iron deficiency anemias: Secondary | ICD-10-CM

## 2021-03-23 DIAGNOSIS — Z3A00 Weeks of gestation of pregnancy not specified: Secondary | ICD-10-CM | POA: Diagnosis present

## 2021-03-23 DIAGNOSIS — D509 Iron deficiency anemia, unspecified: Secondary | ICD-10-CM | POA: Diagnosis present

## 2021-03-23 MED ORDER — SODIUM CHLORIDE 0.9 % IV SOLN
Freq: Once | INTRAVENOUS | Status: AC
  Filled 2021-03-23: qty 250

## 2021-03-23 MED ORDER — SODIUM CHLORIDE 0.9 % IV SOLN: 200.0000 mg | Freq: Once | INTRAVENOUS | Status: DC

## 2021-03-23 MED ORDER — IRON SUCROSE 20 MG/ML IV SOLN
200.0000 mg | Freq: Once | INTRAVENOUS | Status: AC
  Administered 2021-03-23: 200 mg via INTRAVENOUS
  Filled 2021-03-23: qty 10

## 2021-03-23 NOTE — Progress Notes (Signed)
[facility] Department ?Maternal Health Clinic ? ?PRENATAL VISIT NOTE ? ?Subjective:  ?Rachel Simon is a 26 y.o. G1P0 at [redacted]w[redacted]d being seen today for ongoing prenatal care.  She is currently monitored for the following issues for this high-risk pregnancy and has Supervision of normal first pregnancy, antepartum; Anemia Hgb 5.7 on 02/23/21; Heart murmur; Late prenatal care; Anemia affecting pregnancy; Diastolic murmur; Health education/counseling; and IDA (iron deficiency anemia) on their problem list. ? ?Patient reports no complaints.  Contractions: Not present. Vag. Bleeding: None.  Movement: Present. Denies leaking of fluid/ROM.  ? ?The following portions of the patient's history were reviewed and updated as appropriate: allergies, current medications, past family history, past medical history, past social history, past surgical history and problem list. Problem list updated. ? ?Objective:  ? ?Vitals:  ? 03/22/21 1157  ?BP: 113/60  ?Pulse: 75  ?Temp: 98 ?F (36.7 ?C)  ?Weight: 149 lb (67.6 kg)  ? ? ?Fetal Status: Fetal Heart Rate (bpm): 147 Fundal Height: 28 cm Movement: Present    ? ?General:  Alert, oriented and cooperative. Patient is in no acute distress.  ?Skin: Skin is warm and dry. No rash noted.   ?Cardiovascular: Normal heart rate noted  ?Respiratory: Normal respiratory effort, no problems with respiration noted  ?Abdomen: Soft, gravid, appropriate for gestational age.  Pain/Pressure: Absent     ?Pelvic: Cervical exam deferred        ?Extremities: Normal range of motion.  Edema: None  ?Mental Status: Normal mood and affect. Normal behavior. Normal judgment and thought content.  ? ?Assessment and Plan:  ?Pregnancy: G1P0 at [redacted]w[redacted]d ? ?1. Supervision of normal first pregnancy, antepartum ?Pt taking PNV as directed  ?PT expressed that things are going well  ?Discussed expected changes with pregnancy such as ligament pain ?Discussed appropriate weight gain and diet and exercise while pregnant   ?Anticipated guidance- visit change to every 2 weeks  ?Pt aware of cardiology appointment on 04/21/21 ? ? ?2. Anemia, unspecified type ?Pt reports that she is starting to have more energy since 2/23 infusion and is aware of upcoming appointments for infusions.  ? ?3. Yeast detected ?Pt reports that no longer has s/sx and that cream helped ? ? ?Preterm labor symptoms and general obstetric precautions including but not limited to vaginal bleeding, contractions, leaking of fluid and fetal movement were reviewed in detail with the patient. ?Please refer to After Visit Summary for other counseling recommendations.  ?Return in 2 weeks (on 04/05/2021) for [facility] appt. ? ?Future Appointments  ?Date Time Provider [place]  ?03/30/2021 10:15 AM [facility] None  ?03/30/2021 11:00 AM [facility] INFUSION CHAIR 6 [facility] None  ?03/30/2021  1:00 PM [facility] US1 [facility]  ?04/05/2021 10:20 AM [facility] PROVIDER [facility] None  ?04/11/2021 10:30 AM [facility] None  ?04/11/2021 11:15 AM [facility] LAB [facility] None  ?04/13/2021  9:45 AM [facility] None  ?04/13/2021 10:15 AM Earlie Server, MD [facility] None  ?04/13/2021 10:45 AM [facility] INFUSION CHAIR 1 [facility] None  ?04/21/2021  2:00 PM Tobb, Godfrey Pick, DO [facility] None  ? ?Language line Inda Merlin [phone]) used for Swahili interpretation.    ? ?Junious Dresser, FNP ? ?

## 2021-03-24 ENCOUNTER — Encounter: Payer: Self-pay | Admitting: Oncology

## 2021-03-30 ENCOUNTER — Ambulatory Visit: Payer: Medicaid Other | Attending: Maternal & Fetal Medicine

## 2021-03-30 ENCOUNTER — Inpatient Hospital Stay: Payer: Medicaid Other

## 2021-03-30 ENCOUNTER — Other Ambulatory Visit: Payer: Self-pay

## 2021-03-30 ENCOUNTER — Other Ambulatory Visit: Payer: Self-pay | Admitting: Nurse Practitioner

## 2021-03-30 VITALS — BP 110/52 | HR 71 | Temp 97.7°F | Resp 18

## 2021-03-30 DIAGNOSIS — Z363 Encounter for antenatal screening for malformations: Secondary | ICD-10-CM | POA: Insufficient documentation

## 2021-03-30 DIAGNOSIS — I38 Endocarditis, valve unspecified: Secondary | ICD-10-CM

## 2021-03-30 DIAGNOSIS — O99013 Anemia complicating pregnancy, third trimester: Secondary | ICD-10-CM

## 2021-03-30 DIAGNOSIS — O0933 Supervision of pregnancy with insufficient antenatal care, third trimester: Secondary | ICD-10-CM | POA: Diagnosis not present

## 2021-03-30 DIAGNOSIS — O99413 Diseases of the circulatory system complicating pregnancy, third trimester: Secondary | ICD-10-CM

## 2021-03-30 DIAGNOSIS — D509 Iron deficiency anemia, unspecified: Secondary | ICD-10-CM

## 2021-03-30 DIAGNOSIS — Z3A29 29 weeks gestation of pregnancy: Secondary | ICD-10-CM

## 2021-03-30 DIAGNOSIS — Z3689 Encounter for other specified antenatal screening: Secondary | ICD-10-CM

## 2021-03-30 DIAGNOSIS — O99012 Anemia complicating pregnancy, second trimester: Secondary | ICD-10-CM | POA: Diagnosis not present

## 2021-03-30 DIAGNOSIS — D508 Other iron deficiency anemias: Secondary | ICD-10-CM

## 2021-03-30 MED ORDER — SODIUM CHLORIDE 0.9 % IV SOLN: 200.0000 mg | Freq: Once | INTRAVENOUS | Status: DC

## 2021-03-30 MED ORDER — SODIUM CHLORIDE 0.9 % IV SOLN
Freq: Once | INTRAVENOUS | Status: AC
  Filled 2021-03-30: qty 250

## 2021-03-30 MED ORDER — IRON SUCROSE 20 MG/ML IV SOLN
200.0000 mg | Freq: Once | INTRAVENOUS | Status: AC
  Administered 2021-03-30: 11:00:00 200 mg via INTRAVENOUS
  Filled 2021-03-30: qty 10

## 2021-03-30 NOTE — Patient Instructions (Signed)

## 2021-04-05 ENCOUNTER — Ambulatory Visit: Payer: Medicaid Other

## 2021-04-05 ENCOUNTER — Other Ambulatory Visit: Payer: Self-pay | Admitting: *Deleted

## 2021-04-05 DIAGNOSIS — D508 Other iron deficiency anemias: Secondary | ICD-10-CM

## 2021-04-06 ENCOUNTER — Telehealth: Payer: Self-pay

## 2021-04-11 ENCOUNTER — Inpatient Hospital Stay: Payer: Medicaid Other

## 2021-04-11 ENCOUNTER — Other Ambulatory Visit: Payer: Self-pay

## 2021-04-11 DIAGNOSIS — D508 Other iron deficiency anemias: Secondary | ICD-10-CM

## 2021-04-11 DIAGNOSIS — O99012 Anemia complicating pregnancy, second trimester: Secondary | ICD-10-CM | POA: Diagnosis not present

## 2021-04-11 LAB — CBC WITH DIFFERENTIAL/PLATELET
Abs Immature Granulocytes: 0.21 10*3/uL — ABNORMAL HIGH (ref 0.00–0.07)
Basophils Absolute: 0 10*3/uL (ref 0.0–0.1)
Basophils Relative: 0 %
Eosinophils Absolute: 0.1 10*3/uL (ref 0.0–0.5)
Eosinophils Relative: 1 %
HCT: 31.4 % — ABNORMAL LOW (ref 36.0–46.0)
Hemoglobin: 9.9 g/dL — ABNORMAL LOW (ref 12.0–15.0)
Immature Granulocytes: 3 %
Lymphocytes Relative: 21 %
Lymphs Abs: 1.4 10*3/uL (ref 0.7–4.0)
MCH: 27 pg (ref 26.0–34.0)
MCHC: 31.5 g/dL (ref 30.0–36.0)
MCV: 85.6 fL (ref 80.0–100.0)
Monocytes Absolute: 0.6 10*3/uL (ref 0.1–1.0)
Monocytes Relative: 9 %
Neutro Abs: 4.3 10*3/uL (ref 1.7–7.7)
Neutrophils Relative %: 66 %
Platelets: 173 10*3/uL (ref 150–400)
RBC: 3.67 MIL/uL — ABNORMAL LOW (ref 3.87–5.11)
RDW: 26.5 % — ABNORMAL HIGH (ref 11.5–15.5)
WBC: 6.6 10*3/uL (ref 4.0–10.5)
nRBC: 0 % (ref 0.0–0.2)

## 2021-04-11 LAB — IRON AND TIBC
Iron: 103 ug/dL (ref 28–170)
Saturation Ratios: 22 % (ref 10.4–31.8)
TIBC: 459 ug/dL — ABNORMAL HIGH (ref 250–450)
UIBC: 356 ug/dL

## 2021-04-11 LAB — FERRITIN: Ferritin: 122 ng/mL (ref 11–307)

## 2021-04-13 ENCOUNTER — Encounter: Payer: Self-pay | Admitting: Nurse Practitioner

## 2021-04-13 ENCOUNTER — Inpatient Hospital Stay: Payer: Medicaid Other

## 2021-04-13 ENCOUNTER — Other Ambulatory Visit: Payer: Self-pay

## 2021-04-13 ENCOUNTER — Inpatient Hospital Stay (HOSPITAL_BASED_OUTPATIENT_CLINIC_OR_DEPARTMENT_OTHER): Payer: Medicaid Other | Admitting: Nurse Practitioner

## 2021-04-13 VITALS — BP 119/67 | HR 80 | Temp 97.8°F | Ht 63.0 in | Wt 157.8 lb

## 2021-04-13 DIAGNOSIS — O99013 Anemia complicating pregnancy, third trimester: Secondary | ICD-10-CM | POA: Diagnosis not present

## 2021-04-13 DIAGNOSIS — O99012 Anemia complicating pregnancy, second trimester: Secondary | ICD-10-CM | POA: Diagnosis not present

## 2021-04-13 DIAGNOSIS — D649 Anemia, unspecified: Secondary | ICD-10-CM | POA: Diagnosis not present

## 2021-04-13 NOTE — Progress Notes (Signed)
?Hematology/Oncology Consult note ?Telephone:([phone]) [phone] [phone] ?  ? ?Patient Care Team: ?Pcp, No as PCP - General ?Thomasene Ripple, DO as PCP - Cardiology (Cardiology) ?Jeralyn Ruths, MD as Consulting Physician (Hematology) ? ?REFERRING PROVIDER: ?Rickard Patience, MD  ? ?CHIEF COMPLAINTS/REASON FOR VISIT:  ?Evaluation of iron deficiency anemia ? ?HISTORY OF PRESENTING ILLNESS:  ? ?Rachel Simon is a  26 y.o.  female with PMH listed below was seen in consultation at the request of  Rickard Patience, MD  for evaluation of iron deficiency anemia ? ?G1, P0, history of heart murmur and recently being seen by cardiology. ?Patient is currently in second trimester.  Establish care with maternal health clinic at [facility] department. ?02/23/2021, patient had blood work done ?CBC showed hemoglobin 5.7, total white count 8.5, platelet count 300, MCV 68, iron 18, iron saturation 3, iron ferritin 7.  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  HCV negative.  Blood lead within normal limits. ? ?Interval history: Patient is 26 year old female with above history of iron deficiency anemia who returns to clinic for further laboratory evaluation, and consideration of IV iron. Fatigue has improved but persists. She is unsure how many weeks pregnant she is but currently in 3rd trimester. Denies black or bloody stools. Accompanied by her husband. Declined translator.  ? ?Review of Systems  ?Constitutional:  Positive for fatigue. Negative for appetite change, chills and fever.  ?HENT:   Negative for hearing loss and voice change.   ?Eyes:  Negative for eye problems.  ?Respiratory:  Negative for chest tightness and cough.   ?Cardiovascular:  Negative for chest pain.  ?Gastrointestinal:  Negative for abdominal distention, abdominal pain and blood in stool.  ?Endocrine: Negative for hot flashes.  ?Genitourinary:  Negative for difficulty urinating and frequency.    ?Musculoskeletal:  Negative for arthralgias.  ?Skin:  Negative for itching and rash.  ?Neurological:  Negative for extremity weakness.  ?Hematological:  Negative for adenopathy.  ?Psychiatric/Behavioral:  Negative for confusion.   ? ?MEDICAL HISTORY:  ?Past Medical History:  ?Diagnosis Date  ? Anemia 02/23/2021  ? Heart murmur 02/23/2021  ? IDA (iron deficiency anemia)   ? ? ?SURGICAL HISTORY: ?History reviewed. No pertinent surgical history. ? ?SOCIAL HISTORY: ?Social History  ? ?Socioeconomic History  ? Marital status: Married  ?  Spouse name: Mfaume Lumona  ? Number of children: 0  ? Years of education: 66  ? Highest education level: High school graduate  ?Occupational History  ? Occupation: homemaker  ?Tobacco Use  ? Smoking status: Never  ?  Passive exposure: Never  ? Smokeless tobacco: Never  ?Vaping Use  ? Vaping Use: Never used  ?Substance and Sexual Activity  ? Alcohol use: Never  ? Drug use: Never  ? Sexual activity: Yes  ?  Birth control/protection: None  ?Other Topics Concern  ? Not on file  ?Social History Narrative  ? Lives with spouse.  ? ?Social Determinants of Health  ? ?Financial Resource Strain: Medium Risk  ? Difficulty of Paying Living Expenses: Somewhat hard  ?Food Insecurity: Food Insecurity Present  ? Worried About Programme researcher, broadcasting/film/video in the Last Year: Sometimes true  ? Ran Out of Food in the Last Year: Sometimes true  ?Transportation Needs: Unmet Transportation Needs  ? Lack of Transportation (Medical): Yes  ? Lack of Transportation (Non-Medical): Yes  ?Physical Activity: Not on file  ?Stress: Not on file  ?Social Connections: Not on file  ?  Intimate Partner Violence: Not At Risk  ? Fear of Current or Ex-Partner: No  ? Emotionally Abused: No  ? Physically Abused: No  ? Sexually Abused: No  ? ? ?FAMILY HISTORY: ?Family History  ?Problem Relation Age of Onset  ? GI problems Father   ? ? ?ALLERGIES:  has No Known Allergies. ? ?MEDICATIONS:  ?Current Outpatient Medications  ?Medication Sig  Dispense Refill  ? Prenatal Vit-Fe Fumarate-FA (PRENATAL VITAMIN) 27-0.8 MG TABS Take 1 tablet by mouth daily at 6 (six) AM. 100 tablet 0  ? ?No current facility-administered medications for this visit.  ? ? ? ?PHYSICAL EXAMINATION: ?ECOG PERFORMANCE STATUS: 1 - Symptomatic but completely ambulatory ?Vitals:  ? 04/13/21 1038  ?BP: 119/67  ?Pulse: 80  ?Temp: 97.8 ?F (36.6 ?C)  ?SpO2: 100%  ? ?Filed Weights  ? 04/13/21 1038  ?Weight: 157 lb 12.8 oz (71.6 kg)  ? ? ?Physical Exam ?Constitutional:   ?   General: She is not in acute distress. ?HENT:  ?   Head: Normocephalic and atraumatic.  ?Eyes:  ?   General: No scleral icterus. ?Cardiovascular:  ?   Rate and Rhythm: Normal rate and regular rhythm.  ?   Heart sounds: Murmur heard.  ?Pulmonary:  ?   Effort: Pulmonary effort is normal. No respiratory distress.  ?   Breath sounds: No wheezing.  ?Abdominal:  ?   Comments: Gravid uterus  ?Musculoskeletal:  ?   Cervical back: Normal range of motion and neck supple.  ?Skin: ?   General: Skin is warm and dry.  ?   Coloration: Skin is not pale.  ?   Findings: No erythema or rash.  ?Neurological:  ?   Mental Status: She is alert and oriented to person, place, and time. Mental status is at baseline.  ?   Cranial Nerves: No cranial nerve deficit.  ?   Coordination: Coordination normal.  ?Psychiatric:     ?   Mood and Affect: Mood normal.     ?   Behavior: Behavior normal.  ? ? ?LABORATORY DATA:  ?I have reviewed the data as listed ?Lab Results  ?Component Value Date  ? WBC 6.6 04/11/2021  ? HGB 9.9 (L) 04/11/2021  ? HCT 31.4 (L) 04/11/2021  ? MCV 85.6 04/11/2021  ? PLT 173 04/11/2021  ? ?Recent Labs  ?  03/02/21 ?1118  ?NA 135  ?K 3.8  ?CL 103  ?CO2 24  ?GLUCOSE 82  ?BUN 8  ?CREATININE 0.38*  ?CALCIUM 8.7*  ?GFRNONAA >60  ?PROT 6.6  ?ALBUMIN 3.1*  ?AST 20  ?ALT 9  ?ALKPHOS 60  ?BILITOT 0.2*  ?BILIDIR <0.1  ?IBILI NOT CALCULATED  ? ?Iron/TIBC/Ferritin/ %Sat ?   ?Component Value Date/Time  ? IRON 103 04/11/2021 1119  ? IRON 18 (L)  02/23/2021 1600  ? TIBC 459 (H) 04/11/2021 1119  ? TIBC 543 (H) 02/23/2021 1600  ? FERRITIN 122 04/11/2021 1119  ? FERRITIN 7 (L) 02/23/2021 1600  ? IRONPCTSAT 22 04/11/2021 1119  ? IRONPCTSAT 3 (LL) 02/23/2021 1600  ? ?  ? ? ?RADIOGRAPHIC STUDIES: ?I have personally reviewed the radiological images as listed and agreed with the findings in the report. ?US MFM OB DETAIL +14 WK ? ?Result Date: 03/30/2021 ?----------------------------------------------------------------------  OBSTETRICS REPORT                       (Signed Final 03/30/2021 04:47 pm) ---------------------------------------------------------------------- Patient Info  ID #:       [id number]  D.O.B.:  [birth year] (26 yrs)  Name:       Rachel Simon            Visit Date: 03/30/2021 12:52 pm ---------------------------------------------------------------------- Performed By  Attending:        Lin Landsman      Referred By:      Glenna Fellows                    MD  Performed By:     Birdena Crandall        [facility] at                                                             [facility] ---------------------------------------------------------------------- Orders  #  Description                           Code        Ordered By  1  [place] MFM OB DETAIL +14 WK               76811.01    AYO WHITE ----------------------------------------------------------------------  #  Order #                     Accession #                Episode #  1  [id number]                   [id number]                 [id number] ---------------------------------------------------------------------- Indications  Anemia during pregnancy in third trimester     O99.013  Late to prenatal care, third trimester         O09.33  Encounter for antenatal screening for          Z36.3  malformations  Heart disease in mother affecting pregnancy    O99.413  in third  trimester (diastolic murmur)  [redacted] weeks gestation of pregnancy                Z3A.29 ---------------------------------------------------------------------- Fetal Evaluation  Num Of Fetuses:         1  Fetal Heart Rate(bpm):

## 2021-04-13 NOTE — Progress Notes (Signed)
Spouse interpreting.  ?

## 2021-04-14 ENCOUNTER — Telehealth: Payer: Self-pay | Admitting: Family Medicine

## 2021-04-14 ENCOUNTER — Ambulatory Visit: Payer: Medicaid Other

## 2021-04-14 NOTE — Telephone Encounter (Signed)
Spoke with husband and he said that they missed the appt today because no one ever came to pick them up.  He said that we usually send someone to pick them up and take them back home but that today the transportation never arrived. Rachel Simon was rescheduled for Monday 3/27 at 2:00 pm. ?

## 2021-04-16 ENCOUNTER — Encounter: Payer: Self-pay | Admitting: Oncology

## 2021-04-17 ENCOUNTER — Ambulatory Visit: Payer: Medicaid Other | Admitting: Advanced Practice Midwife

## 2021-04-17 ENCOUNTER — Other Ambulatory Visit: Payer: Self-pay

## 2021-04-17 VITALS — Wt 156.5 lb

## 2021-04-17 DIAGNOSIS — O0930 Supervision of pregnancy with insufficient antenatal care, unspecified trimester: Secondary | ICD-10-CM

## 2021-04-17 DIAGNOSIS — Z23 Encounter for immunization: Secondary | ICD-10-CM | POA: Diagnosis not present

## 2021-04-17 DIAGNOSIS — O0933 Supervision of pregnancy with insufficient antenatal care, third trimester: Secondary | ICD-10-CM

## 2021-04-17 DIAGNOSIS — Z3400 Encounter for supervision of normal first pregnancy, unspecified trimester: Secondary | ICD-10-CM

## 2021-04-17 DIAGNOSIS — D649 Anemia, unspecified: Secondary | ICD-10-CM

## 2021-04-17 DIAGNOSIS — R011 Cardiac murmur, unspecified: Secondary | ICD-10-CM

## 2021-04-17 DIAGNOSIS — O99013 Anemia complicating pregnancy, third trimester: Secondary | ICD-10-CM

## 2021-04-17 DIAGNOSIS — Z3403 Encounter for supervision of normal first pregnancy, third trimester: Secondary | ICD-10-CM

## 2021-04-17 LAB — HEMOGLOBIN, FINGERSTICK: Hemoglobin: 10.2 g/dL — ABNORMAL LOW (ref 11.1–15.9)

## 2021-04-17 NOTE — Progress Notes (Signed)
Hgb 10.2 today, provider aware.Burt Knack, RN  ?

## 2021-04-17 NOTE — Progress Notes (Signed)
Patient here for MH RV at 32 weeks. 28 week labs today, Tdap and 1 hour gtt. Needs to be lab by 2:50. Kick counts reviewed and cards given. Tdap VIS given.Jenetta Downer, RN  ?

## 2021-04-17 NOTE — Progress Notes (Signed)
Tdap given, left deltoid, tolerated well, NCIR in accordian folder.Jenetta Downer, RN  ?

## 2021-04-17 NOTE — Progress Notes (Signed)
[facility] Department ?Maternal Health Clinic ? ?PRENATAL VISIT NOTE ? ?Subjective:  ?Rachel Simon is a 26 y.o. G1P0 at 78w0dbeing seen today for ongoing prenatal care.  She is currently monitored for the following issues for this high-risk pregnancy and has Supervision of normal first pregnancy, antepartum; Anemia Hgb 5.7 on 20/9/47 diastolic heart murmer; Late prenatal care  24 3/7; Anemia affecting pregnancy; Health education/counseling; and IDA (iron deficiency anemia) on their problem list. ? ?Patient reports no complaints.  Contractions: Not present. Vag. Bleeding: None.  Movement: Present. Denies leaking of fluid/ROM.  ? ?The following portions of the patient's history were reviewed and updated as appropriate: allergies, current medications, past family history, past medical history, past social history, past surgical history and problem list. Problem list updated. ? ?Objective:  ? ?Vitals:  ? 04/17/21 1426  ?Weight: 156 lb 8 oz (71 kg)  ? ? ?Fetal Status: Fetal Heart Rate (bpm): 150 Fundal Height: 36 cm Movement: Present    ? ?General:  Alert, oriented and cooperative. Patient is in no acute distress.  ?Skin: Skin is warm and dry. No rash noted.   ?Cardiovascular: Normal heart rate noted  ?Respiratory: Normal respiratory effort, no problems with respiration noted  ?Abdomen: Soft, gravid, appropriate for gestational age.  Pain/Pressure: Absent     ?Pelvic: Cervical exam deferred        ?Extremities: Normal range of motion.  Edema: None  ?Mental Status: Normal mood and affect. Normal behavior. Normal judgment and thought content.  ? ?Assessment and Plan:  ?Pregnancy: G1P0 at 360w0d ?1. Supervision of normal first pregnancy, antepartum ? [facility] glucola and RV because states transportation did not show to pick her up ?Couple states they do not have a sponsor except for R. ToKarrie DoffingLCSW ?Reviewed 03/30/21 u/s at 31 1/7 with AFI wnl, EFW=95% ?Has u/s  04/27/21 ?1 hour glucola today at 32 wks ?Walking 1x/wk x 1 hour ? ?- Glucose tolerance, 1 hour ?- HIV-1/HIV-2 Qualitative RNA ?- RPR ?- Hemoglobin, fingerstick ? ?3. Heart murmur ?Met with cardiologist 02/28/21, EKG done,  and [facility] an [facility] 03/07/21 [facility]. Chat sent to Dr. ToHarriet Massono please reschedule ECHO. ? ?4. Anemia affecting pregnancy in third trimester ?Not taking FeSo4 ?Hematology consult with Dr. FiGrayland Ormondn 03/02/21 with Hgb 5.6 (on 03/02/21). On 03/03/21 given 2UPRBC and Dr. FiGrayland Ormondrdered IV Venofer 200 mg weekly x 4 doses.  ?IV Venofer on 03/09/21, 03/16/21, 03/23/21, 03/30/21 ?[facility] and Hgb=9.9 so IV iron not given ?Has f/u lab on 05/17/21 and f/u with Dr. FiGrayland Ormondn 05/19/21 ? ? ?5. Late prenatal care ? At 24 3/7 ? ? ?Preterm labor symptoms and general obstetric precautions including but not limited to vaginal bleeding, contractions, leaking of fluid and fetal movement were reviewed in detail with the patient. ?Please refer to After Visit Summary for other counseling recommendations.  ?No follow-ups on file. ? ?Future Appointments  ?Date Time Provider [place]?04/21/2021  2:00 PM Tobb, KaGodfrey PickDO [facility] None  ?04/27/2021 10:00 AM [facility] US1 [facility]  ?05/01/2021 10:40 AM [facility]-MH PROVIDER AC-MAT None  ?05/17/2021 10:30 AM [facility] VAN [facility] None  ?05/17/2021 11:45 AM [facility] LAB [facility] None  ?05/19/2021  9:45 AM [facility] V[facility] None  ?05/19/2021 10:30 AM YuEarlie ServerMD [facility] None  ?05/19/2021 11:00 AM [facility]- MO INFUSION CHAIR 3 [facility] None  ? ? ?ElBenjamine Mola  A [place] Agostinelli, CNM ? ?

## 2021-04-19 LAB — GLUCOSE, 1 HOUR GESTATIONAL: Gestational Diabetes Screen: 97 mg/dL (ref 70–139)

## 2021-04-19 LAB — HIV-1/HIV-2 QUALITATIVE RNA
HIV-1 RNA, Qualitative: NONREACTIVE
HIV-2 RNA, Qualitative: NONREACTIVE

## 2021-04-19 LAB — RPR: RPR Ser Ql: NONREACTIVE

## 2021-04-21 ENCOUNTER — Ambulatory Visit: Payer: Medicaid Other | Admitting: Cardiology

## 2021-04-25 ENCOUNTER — Other Ambulatory Visit: Payer: Self-pay

## 2021-04-25 DIAGNOSIS — O0930 Supervision of pregnancy with insufficient antenatal care, unspecified trimester: Secondary | ICD-10-CM

## 2021-04-25 DIAGNOSIS — O99013 Anemia complicating pregnancy, third trimester: Secondary | ICD-10-CM

## 2021-04-25 DIAGNOSIS — R011 Cardiac murmur, unspecified: Secondary | ICD-10-CM

## 2021-04-27 ENCOUNTER — Ambulatory Visit: Payer: Medicaid Other

## 2021-04-27 NOTE — Progress Notes (Deleted)
Pt was a "No Show" today for [facility] MFM appt.  ?

## 2021-05-01 ENCOUNTER — Ambulatory Visit: Payer: Medicaid Other

## 2021-05-08 ENCOUNTER — Ambulatory Visit: Payer: Medicaid Other

## 2021-05-08 ENCOUNTER — Encounter: Payer: Self-pay | Admitting: Emergency Medicine

## 2021-05-08 ENCOUNTER — Other Ambulatory Visit: Payer: Self-pay

## 2021-05-08 ENCOUNTER — Emergency Department: Payer: Medicaid Other

## 2021-05-08 ENCOUNTER — Telehealth: Payer: Self-pay

## 2021-05-08 ENCOUNTER — Emergency Department
Admission: EM | Admit: 2021-05-08 | Discharge: 2021-05-08 | Disposition: A | Discharge status: Home / Self Care | Payer: Medicaid Other | Attending: Emergency Medicine | Admitting: Emergency Medicine

## 2021-05-08 DIAGNOSIS — O99893 Other specified diseases and conditions complicating puerperium: Secondary | ICD-10-CM | POA: Diagnosis present

## 2021-05-08 DIAGNOSIS — R0600 Dyspnea, unspecified: Secondary | ICD-10-CM | POA: Diagnosis not present

## 2021-05-08 DIAGNOSIS — R0602 Shortness of breath: Secondary | ICD-10-CM | POA: Insufficient documentation

## 2021-05-08 LAB — TROPONIN I (HIGH SENSITIVITY): Troponin I (High Sensitivity): 4 ng/L (ref <18)

## 2021-05-08 LAB — CBC
HCT: 35.4 % — ABNORMAL LOW (ref 36.0–46.0)
Hemoglobin: 11.3 g/dL — ABNORMAL LOW (ref 12.0–15.0)
MCH: 28.4 pg (ref 26.0–34.0)
MCHC: 31.9 g/dL (ref 30.0–36.0)
MCV: 88.9 fL (ref 80.0–100.0)
Platelets: 200 10*3/uL (ref 150–400)
RBC: 3.98 MIL/uL (ref 3.87–5.11)
RDW: 19 % — ABNORMAL HIGH (ref 11.5–15.5)
WBC: 6.8 10*3/uL (ref 4.0–10.5)
nRBC: 0 % (ref 0.0–0.2)

## 2021-05-08 LAB — COMPREHENSIVE METABOLIC PANEL
ALT: 15 U/L (ref 0–44)
AST: 24 U/L (ref 15–41)
Albumin: 3.4 g/dL — ABNORMAL LOW (ref 3.5–5.0)
Alkaline Phosphatase: 164 U/L — ABNORMAL HIGH (ref 38–126)
Anion gap: 9 (ref 5–15)
BUN: 7 mg/dL (ref 6–20)
CO2: 19 mmol/L — ABNORMAL LOW (ref 22–32)
Calcium: 9.2 mg/dL (ref 8.9–10.3)
Chloride: 106 mmol/L (ref 98–111)
Creatinine, Ser: 0.47 mg/dL (ref 0.44–1.00)
GFR, Estimated: >60 mL/min (ref >60)
Glucose, Bld: 84 mg/dL (ref 70–99)
Potassium: 3.8 mmol/L (ref 3.5–5.1)
Sodium: 134 mmol/L — ABNORMAL LOW (ref 135–145)
Total Bilirubin: 0.6 mg/dL (ref 0.3–1.2)
Total Protein: 7 g/dL (ref 6.5–8.1)

## 2021-05-08 NOTE — ED Provider Notes (Signed)
? ?[facility] ?Provider Note ? ? ? Event Date/Time  ? First MD Initiated Contact with Patient 05/08/21 2107   ?  (approximate) ? ?History  ? ?Chief Complaint: Shortness of Breath ? ?HPI ? ?Rachel Simon is a 26 y.o. female 8 months pregnant with a history of anemia who presents to the emergency department for shortness of breath.  According to the patient this is her first pregnancy.  She states for the past 1 month or so she will intermittently become short of breath.  Patient denies any shortness of breath currently.  Denies any chest pain at any point.  Patient came for evaluation for the shortness of breath. ? ?Physical Exam  ? ?Triage Vital Signs: ?ED Triage Vitals  ?Enc Vitals Group  ?   BP 05/08/21 1856 108/75  ?   Pulse Rate 05/08/21 1856 77  ?   Resp 05/08/21 1856 (!) 24  ?   Temp 05/08/21 1856 98.3 ?F (36.8 ?C)  ?   Temp Source 05/08/21 1856 Oral  ?   SpO2 05/08/21 1856 94 %  ?   Weight 05/08/21 1857 154 lb 5.2 oz (70 kg)  ?   Height 05/08/21 1857 5' (1.524 m)  ?   Head Circumference --   ?   Peak Flow --   ?   Pain Score 05/08/21 1857 0  ?   Pain Loc --   ?   Pain Edu? --   ?   Excl. in GC? --   ? ? ?Most recent vital signs: ?Vitals:  ? 05/08/21 1856  ?BP: 108/75  ?Pulse: 77  ?Resp: (!) 24  ?Temp: 98.3 ?F (36.8 ?C)  ?SpO2: 94%  ? ? ?General: Awake, no distress.  ?CV:  Good peripheral perfusion.  Regular rate and rhythm  ?Resp:  Normal effort.  Equal breath sounds bilaterally.  ?Abd:  No distention.  Soft, nontender.  No rebound or guarding. ?Other:  Mild pedal edema bilaterally. ? ? ?ED Results / Procedures / Treatments  ? ?EKG ? ?EKG viewed and interpreted by myself shows normal sinus rhythm at 76 bpm with a narrow QRS, normal axis, normal intervals, no concerning ST changes. ? ?RADIOLOGY ? ?I personally reviewed the patient's chest x-ray images, no acute abnormality seen on my evaluation. ?Radiology is read the chest x-ray is negative ? ? ?MEDICATIONS ORDERED IN  ED: ?Medications - No data to display ? ? ?IMPRESSION / MDM / ASSESSMENT AND PLAN / ED COURSE  ?I reviewed the triage vital signs and the nursing notes. ? ?Patient presents to the emergency department for shortness of breath intermittent over the past 1 month.  Patient is approximately 8 months pregnant and this is her first pregnancy.  Differential is quite broad but would include pulmonary edema, less likely PE given no chest pain no pleuritic pain.  Patient does have mild lower extremity edema.  Clear lung sounds with no wheeze rales or rhonchi.  Also could be related to increased abdominal girth/pressure leading to difficulty breathing or possible pressure on the inferior vena cava.  We will check labs, chest x-ray with abdominal shielding, EKG and continue to closely monitor.  Patient agreeable to plan.  Overall patient appears well, asking when she can go home and eat. ? ?Patient's work-up is overall reassuring.  Patient's labs are normal including a normal troponin.  Reassuring chemistry and CBC.  Patient's chest x-ray is negative and EKG is reassuring.  Given her reassuring work-up and that the patient  is asymptomatic currently I believe the patient will be safe for discharge home with OB/GYN follow-up.  Patient agreeable to plan of care.  Discussed my typical dyspnea return precautions. ? ?FINAL CLINICAL IMPRESSION(S) / ED DIAGNOSES  ? ?Dyspnea of pregnancy ? ?Note:  This document was prepared using Dragon voice recognition software and may include unintentional dictation errors. ?  Minna Antis, MD ?05/08/21 2230 ? ?

## 2021-05-08 NOTE — Telephone Encounter (Signed)
Call to client with [facility] Interpreters ID # [phone] as [facility] for [facility] RV this am. Per client, she had no transportation for appt. Secure chat sent to R. Marlan Palau MSW, [facility] Care Manager regarding above. Per Ms. Marlan Palau, she has discussed how to arrange Medicaid transportation previously with client and her husband. Client speaks some [place] and spouse speaks [place]. Per Ms. Marlan Palau, she will contact client and review how to set up Medicaid transportation. Jossie Ng, RN ? ?

## 2021-05-08 NOTE — ED Triage Notes (Signed)
Pt with intermittent SOB. Pt states that she is SOB at this time, resp 24, sats 94% RA, breath sounds clear. Per pt no hx asthma or SOB. Pt currently 8 months pregnant, first pregnancy.  ?

## 2021-05-12 ENCOUNTER — Ambulatory Visit: Payer: Medicaid Other | Admitting: Advanced Practice Midwife

## 2021-05-12 VITALS — BP 99/60 | HR 74 | Temp 97.2°F | Wt 164.0 lb

## 2021-05-12 DIAGNOSIS — O0933 Supervision of pregnancy with insufficient antenatal care, third trimester: Secondary | ICD-10-CM

## 2021-05-12 DIAGNOSIS — Z3403 Encounter for supervision of normal first pregnancy, third trimester: Secondary | ICD-10-CM

## 2021-05-12 DIAGNOSIS — R011 Cardiac murmur, unspecified: Secondary | ICD-10-CM

## 2021-05-12 DIAGNOSIS — O0930 Supervision of pregnancy with insufficient antenatal care, unspecified trimester: Secondary | ICD-10-CM

## 2021-05-12 DIAGNOSIS — O99013 Anemia complicating pregnancy, third trimester: Secondary | ICD-10-CM

## 2021-05-12 DIAGNOSIS — Z3400 Encounter for supervision of normal first pregnancy, unspecified trimester: Secondary | ICD-10-CM

## 2021-05-12 LAB — HEMOGLOBIN, FINGERSTICK: Hemoglobin: 11.2 g/dL (ref 11.1–15.9)

## 2021-05-12 NOTE — Progress Notes (Signed)
[employer] Interpretor utilized during visit to speak Swahili [phone]). Patient denies SOB. Ellison Carwin in to speak with patient. Peds list discussed.  ? ?Reminded of appointment on 04/26, 04/27 and 04/28. Hemoglobin reviewed by provider prior to discharge.  ?Delynn Flavin RN ?

## 2021-05-12 NOTE — Progress Notes (Signed)
[facility] Department ?Maternal Health Clinic ? ?PRENATAL VISIT NOTE ? ?Subjective:  ?Rachel Simon is a 26 y.o. G1P0 at [redacted]w[redacted]d being seen today for ongoing prenatal care.  She is currently monitored for the following issues for this high-risk pregnancy and has Supervision of normal first pregnancy, antepartum; Anemia Hgb 5.7 on [id number]; diastolic heart murmer; Late prenatal care  24 3/7; Anemia affecting pregnancy; Health education/counseling; and IDA (iron deficiency anemia) on their problem list. ? ?Patient reports no complaints.  Contractions: Not present. Vag. Bleeding: None.  Movement: Present. Denies leaking of fluid/ROM.  ? ?The following portions of the patient's history were reviewed and updated as appropriate: allergies, current medications, past family history, past medical history, past social history, past surgical history and problem list. Problem list updated. ? ?Objective:  ? ?Vitals:  ? 05/12/21 0910  ?BP: 99/60  ?Pulse: 74  ?Temp: (!) 97.2 ?F (36.2 ?C)  ?Weight: 164 lb (74.4 kg)  ? ? ?Fetal Status: Fetal Heart Rate (bpm): 160 Fundal Height: 38 cm Movement: Present  Presentation: Vertex ? ?General:  Alert, oriented and cooperative. Patient is in no acute distress.  ?Skin: Skin is warm and dry. No rash noted.   ?Cardiovascular: Normal heart rate noted  ?Respiratory: Normal respiratory effort, no problems with respiration noted  ?Abdomen: Soft, gravid, appropriate for gestational age.  Pain/Pressure: Absent     ?Pelvic: Cervical exam deferred        ?Extremities: Normal range of motion.  Edema: None  ?Mental Status: Normal mood and affect. Normal behavior. Normal judgment and thought content.  ? ?Assessment and Plan:  ?Pregnancy: G1P0 at [redacted]w[redacted]d ? ?1. Anemia affecting pregnancy in third trimester ?Apt with Dr. Grayland Ormond 03/02/21. ?Given 2UPRBC on 03/03/21 and ordered IV Venofer 200 mg weekly x 4 doses ?IV Venofer given 03/09/21, 03/16/21, 03/23/21, 03/30/21.  No IV iron given at 04/13/21 apt. Hgb  was 9.9 ?Next apt for labs ? 05/17/21 (CBC, ferritin, B12, folate, iron studies) ?- Hemoglobin, venipuncture ? ?2. Late prenatal care  24 3/7 ? ? ?3. Supervision of normal first pregnancy, antepartum ?8 wt gain in last 3 wks ?23 lb wt gain this pregnancy ?S>D; u/s ordered ?[facility] 04/27/21 growth u/s ?Reviewed first and only u/s on 03/30/21 at 31 1/7 with anterior placenta, normal anatomy, EFW=95%, AFI wnl ?No car seat yet ?R. Karrie Doffing, LCSW in to see pt ?Walking 1x/wk x 30 min ?1 hour glucola=97 on 04/17/21 ? ?- Hemoglobin, venipuncture ? ?4. diastolic heart murmer ?Kept  02/28/21 cardiology consult and EKG done; recommended ECHO ?[facility] 03/07/21 ECHO; attempted to chat MD to reschedule--no response ?RN to call and reschedule ECHO=05/18/21 11:00 ?[facility] 04/21/21 cardiology f/u ? ? ? ?Preterm labor symptoms and general obstetric precautions including but not limited to vaginal bleeding, contractions, leaking of fluid and fetal movement were reviewed in detail with the patient. ?Please refer to After Visit Summary for other counseling recommendations.  ?Return in about 1 week (around 05/19/2021) for routine PNC. ? ?Future Appointments  ?Date Time Provider [place]  ?05/17/2021 10:30 AM [facility] None  ?05/17/2021 11:45 AM [facility] LAB [facility] None  ?05/19/2021  9:45 AM [facility] VAN [facility] None  ?05/19/2021 10:30 AM Earlie Server, MD [facility] None  ?05/19/2021 11:00 AM [facility]- [facility] INFUSION CHAIR 3 [facility] None  ? ? ?Herbie Saxon, CNM ? ?

## 2021-05-17 ENCOUNTER — Inpatient Hospital Stay: Payer: Medicaid Other | Attending: Oncology

## 2021-05-17 ENCOUNTER — Inpatient Hospital Stay: Payer: Medicaid Other

## 2021-05-18 ENCOUNTER — Other Ambulatory Visit: Payer: Medicaid Other

## 2021-05-18 ENCOUNTER — Ambulatory Visit: Payer: Medicaid Other | Admitting: Oncology

## 2021-05-18 ENCOUNTER — Ambulatory Visit: Payer: Medicaid Other

## 2021-05-18 ENCOUNTER — Ambulatory Visit
Admission: RE | Admit: 2021-05-18 | Discharge: 2021-05-18 | Disposition: A | Discharge status: Home / Self Care | Payer: Medicaid Other | Source: Ambulatory Visit | Attending: Advanced Practice Midwife | Admitting: Advanced Practice Midwife

## 2021-05-18 DIAGNOSIS — I517 Cardiomegaly: Secondary | ICD-10-CM | POA: Diagnosis not present

## 2021-05-18 DIAGNOSIS — J449 Chronic obstructive pulmonary disease, unspecified: Secondary | ICD-10-CM | POA: Diagnosis not present

## 2021-05-18 DIAGNOSIS — E119 Type 2 diabetes mellitus without complications: Secondary | ICD-10-CM | POA: Insufficient documentation

## 2021-05-18 DIAGNOSIS — I509 Heart failure, unspecified: Secondary | ICD-10-CM | POA: Diagnosis not present

## 2021-05-18 DIAGNOSIS — R011 Cardiac murmur, unspecified: Secondary | ICD-10-CM | POA: Insufficient documentation

## 2021-05-18 LAB — ECHOCARDIOGRAM COMPLETE
AR max vel: 1.8 cm2
AV Area VTI: 1.94 cm2
AV Area mean vel: 1.63 cm2
AV Mean grad: 4 mmHg
AV Peak grad: 7 mmHg
Ao pk vel: 1.32 m/s
Area-P 1/2: 3.11 cm2
MV VTI: 1.46 cm2
S' Lateral: 3.1 cm

## 2021-05-18 NOTE — Progress Notes (Signed)
*  PRELIMINARY RESULTS* ?Echocardiogram ?2D Echocardiogram has been performed. ? ?Jenny Omdahl, Sonia Side ?05/18/2021, 11:17 AM ?

## 2021-05-19 ENCOUNTER — Inpatient Hospital Stay: Payer: Medicaid Other | Admitting: Oncology

## 2021-05-19 ENCOUNTER — Ambulatory Visit: Payer: Medicaid Other

## 2021-05-19 ENCOUNTER — Inpatient Hospital Stay: Payer: Medicaid Other

## 2021-05-19 ENCOUNTER — Telehealth: Payer: Self-pay

## 2021-05-19 NOTE — Telephone Encounter (Signed)
[facility] as scheduled in [facility] this am. Call to client with [facility] Interpreters ID # [phone] and left message to call and reschedule appt. Number to call provided. ? ?Call received from R. Marlan Palau MSW, OBCM who states cab went to client's home to pick her up for appt, but client / spouse had overslept per her phone call with them this am. Per Ms Marlan Palau, [place] transportation will be bringing them to next appt and husband will call and schedule ride. ? ?Per Ms. Marlan Palau, Medicaid transportation needs 3 days notice for appts, so [facility] RV appt needs to be no earlier than 05/25/2021. Ms. Marlan Palau was told by husband that he would call for appt today and then call her for a 3-way call to arrange Medicaid transportation. Jossie Ng, RN ? ?

## 2021-05-24 NOTE — Telephone Encounter (Signed)
Call to client to reschedule missed [facility] RV appt from multiple days ago. Call to client with [facility] Interpre[employer] ID # [phone] and left message to call and schedule appt in [facility]. Appt line number provided. Lindaann Slough MSW, OBCM notified via secure chat of above. Rich Number, RN ? ?

## 2021-05-25 ENCOUNTER — Telehealth: Payer: Self-pay

## 2021-05-30 ENCOUNTER — Inpatient Hospital Stay: Payer: Medicaid Other | Attending: Oncology

## 2021-05-30 ENCOUNTER — Inpatient Hospital Stay: Payer: Medicaid Other

## 2021-05-30 DIAGNOSIS — Z3A00 Weeks of gestation of pregnancy not specified: Secondary | ICD-10-CM | POA: Diagnosis not present

## 2021-05-30 DIAGNOSIS — O99013 Anemia complicating pregnancy, third trimester: Secondary | ICD-10-CM | POA: Diagnosis present

## 2021-05-30 DIAGNOSIS — D649 Anemia, unspecified: Secondary | ICD-10-CM

## 2021-05-30 LAB — CBC WITH DIFFERENTIAL/PLATELET
Abs Immature Granulocytes: 0.08 10*3/uL — ABNORMAL HIGH (ref 0.00–0.07)
Basophils Absolute: 0 10*3/uL (ref 0.0–0.1)
Basophils Relative: 0 %
Eosinophils Absolute: 0.1 10*3/uL (ref 0.0–0.5)
Eosinophils Relative: 1 %
HCT: 35.9 % — ABNORMAL LOW (ref 36.0–46.0)
Hemoglobin: 11.9 g/dL — ABNORMAL LOW (ref 12.0–15.0)
Immature Granulocytes: 2 %
Lymphocytes Relative: 37 %
Lymphs Abs: 1.8 10*3/uL (ref 0.7–4.0)
MCH: 29 pg (ref 26.0–34.0)
MCHC: 33.1 g/dL (ref 30.0–36.0)
MCV: 87.3 fL (ref 80.0–100.0)
Monocytes Absolute: 0.4 10*3/uL (ref 0.1–1.0)
Monocytes Relative: 9 %
Neutro Abs: 2.4 10*3/uL (ref 1.7–7.7)
Neutrophils Relative %: 51 %
Platelets: 175 10*3/uL (ref 150–400)
RBC: 4.11 MIL/uL (ref 3.87–5.11)
RDW: 14.6 % (ref 11.5–15.5)
WBC: 4.7 10*3/uL (ref 4.0–10.5)
nRBC: 0 % (ref 0.0–0.2)

## 2021-05-30 LAB — IRON AND TIBC
Iron: 59 ug/dL (ref 28–170)
Saturation Ratios: 10 % — ABNORMAL LOW (ref 10.4–31.8)
TIBC: 594 ug/dL — ABNORMAL HIGH (ref 250–450)
UIBC: 535 ug/dL

## 2021-05-30 LAB — FOLATE: Folate: 31 ng/mL (ref >5.9)

## 2021-05-30 LAB — FERRITIN: Ferritin: 12 ng/mL (ref 11–307)

## 2021-05-30 LAB — VITAMIN B12: Vitamin B-12: 165 pg/mL — ABNORMAL LOW (ref 180–914)

## 2021-06-01 ENCOUNTER — Telehealth: Payer: Self-pay

## 2021-06-01 ENCOUNTER — Inpatient Hospital Stay: Payer: Medicaid Other

## 2021-06-01 ENCOUNTER — Ambulatory Visit: Payer: Medicaid Other | Admitting: Oncology

## 2021-06-01 ENCOUNTER — Inpatient Hospital Stay: Payer: Medicaid Other | Admitting: Oncology

## 2021-06-01 ENCOUNTER — Ambulatory Visit: Payer: Medicaid Other

## 2021-06-01 ENCOUNTER — Telehealth: Payer: Self-pay | Admitting: *Deleted

## 2021-06-01 ENCOUNTER — Telehealth: Payer: Self-pay | Admitting: Oncology

## 2021-06-01 NOTE — Telephone Encounter (Signed)
Per R. Marlan Palau MSW and OBCM, client did not keep recent appointments as overslept and was in the bed when the Zenaida Niece came to pick her up. Ms Marlan Palau has [facility] phone number and plans to call client /spouse to give them number to call and reschedule appt. Also plans to remind them that [facility] RV appt needed and verify they have appt line number and Medicaid transportation number. Jossie Ng, RN ? ?

## 2021-06-01 NOTE — Telephone Encounter (Signed)
Telephone call to patient today regarding her missed MH RV.  Left a message for patient to call [phone] to reschedule her MH RV.  Swahili interpreter used today through the [employer].  Mahamad / [phone].  Hart Carwin, RN ? ? ?Rasheda notified today via chat of phone call.  Hart Carwin, RN ? ?

## 2021-06-01 NOTE — Addendum Note (Signed)
Addended by: Cletis Media on: 06/01/2021 01:43 PM ? ? Modules accepted: Orders ? ?

## 2021-06-01 NOTE — Telephone Encounter (Signed)
Received a message from scheduling line that patient would like a return call ?

## 2021-06-01 NOTE — Telephone Encounter (Signed)
Received message from Rachel Simon stating that pt was set up for transportation and confired appt but when driver went to her home, nobody answered.  ? ?Please r/s MD/ venofer to next week with transportation. Please inform pt of new appt.  ?

## 2021-06-01 NOTE — Telephone Encounter (Signed)
Thanks. That sounds good. We will r/s once she calls.  ?

## 2021-06-01 NOTE — Telephone Encounter (Signed)
Left VM with patient (with Swahili interpretor on the line) and requested she please call back to reschedule her missed appointment.  ? ? ?

## 2021-06-02 ENCOUNTER — Encounter: Payer: Self-pay | Admitting: Oncology

## 2021-06-02 ENCOUNTER — Telehealth: Payer: Self-pay | Admitting: Oncology

## 2021-06-02 NOTE — Telephone Encounter (Signed)
Please reach out to patient for scheduling needs. ?

## 2021-06-02 NOTE — Telephone Encounter (Signed)
LVM w/ help of interpreter returning req for pt to be scheduled.Doristine Church  ?

## 2021-06-05 ENCOUNTER — Telehealth: Payer: Self-pay

## 2021-06-06 NOTE — Telephone Encounter (Signed)
Call to client with [facility] [employer]preters ID #[id number] and left message to call Maternity Clinic. Number to call provided. Jossie Ng, RN ? ?

## 2021-06-07 ENCOUNTER — Encounter: Payer: Self-pay | Admitting: Family Medicine

## 2021-06-07 LAB — ALPHA-THALASSEMIA GENOTYPR

## 2021-06-07 NOTE — Progress Notes (Unsigned)
Patient ID: Para March A. Mckeough, female   DOB: 1995/03/21, 26 y.o.   MRN: [id number] ?[facility] Department Maternity Care Conference ? ?Maternity Care Conference Date: 06/07/21 ? ?Lilia A. Poteat was identified by clinical staff to benefit from an interdisciplinary team approach to help improve pregnancy care.  The [facility] Maternity Care Conference includes the maternity clinic coordinator (RN), medical providers (MD/APP staff), Care Management -OBCM and Healthy Beginnings, Centering Pregnancy coordinator, Infant Mortality reduction Dietitian.  Nursing staff are also encouraged to participate. The group meets monthly to discuss patient care and coordinate services.  ? ?The patient's care care at the agency was reviewed in EMR and high risk factors evaluated in an interdisciplinary approach.   ? ?Value added interventions discussed at this care conference today were: ? ? ? ?[facility] Meeting 06/01/21 ?Patient missed hematology appointment  ?Caseworker patient stated that she overslept and did not make her appointment  ?Patient needs MH appointment asap ?Nurses have called patient 3x and patient now needs to call in for appointment  ?Continue to see patient and check blood levels.  ?L.Rojas  ?

## 2021-06-14 ENCOUNTER — Telehealth: Payer: Self-pay

## 2021-06-14 NOTE — Telephone Encounter (Signed)
Call to client with [facility] Interpreters ID # [phone] to reschedule missed [facility] RV appt. Left message to call with number to call provided. Jossie Ng, RN

## 2021-06-15 NOTE — Telephone Encounter (Signed)
Telephone call to patient regarding her missed MH RV.  Left a message to call [phone] to reschedule appointment.  Language Line used today.  Swahili Interpreter used.  Herbert Pun / [phone].  Dahlia Bailiff, RN

## 2021-06-16 ENCOUNTER — Other Ambulatory Visit: Payer: Self-pay

## 2021-06-16 ENCOUNTER — Encounter: Payer: Self-pay | Admitting: Obstetrics and Gynecology

## 2021-06-16 ENCOUNTER — Inpatient Hospital Stay
Admission: EM | Admit: 2021-06-16 | Discharge: 2021-06-19 | DRG: 787 | Disposition: A | Discharge status: Home / Self Care | Payer: Medicaid Other | Attending: Obstetrics and Gynecology | Admitting: Obstetrics and Gynecology

## 2021-06-16 DIAGNOSIS — Z3A40 40 weeks gestation of pregnancy: Secondary | ICD-10-CM

## 2021-06-16 DIAGNOSIS — O480 Post-term pregnancy: Principal | ICD-10-CM | POA: Diagnosis present

## 2021-06-16 DIAGNOSIS — Z3403 Encounter for supervision of normal first pregnancy, third trimester: Secondary | ICD-10-CM | POA: Diagnosis not present

## 2021-06-16 DIAGNOSIS — O9081 Anemia of the puerperium: Secondary | ICD-10-CM | POA: Diagnosis not present

## 2021-06-16 DIAGNOSIS — O324XX0 Maternal care for high head at term, not applicable or unspecified: Secondary | ICD-10-CM | POA: Diagnosis present

## 2021-06-16 DIAGNOSIS — D62 Acute posthemorrhagic anemia: Secondary | ICD-10-CM | POA: Diagnosis not present

## 2021-06-16 DIAGNOSIS — O339 Maternal care for disproportion, unspecified: Secondary | ICD-10-CM | POA: Diagnosis present

## 2021-06-16 DIAGNOSIS — Z3400 Encounter for supervision of normal first pregnancy, unspecified trimester: Principal | ICD-10-CM

## 2021-06-16 DIAGNOSIS — D649 Anemia, unspecified: Secondary | ICD-10-CM

## 2021-06-16 LAB — CBC
HCT: 35.7 % — ABNORMAL LOW (ref 36.0–46.0)
Hemoglobin: 11.8 g/dL — ABNORMAL LOW (ref 12.0–15.0)
MCH: 28.6 pg (ref 26.0–34.0)
MCHC: 33.1 g/dL (ref 30.0–36.0)
MCV: 86.7 fL (ref 80.0–100.0)
Platelets: 212 10*3/uL (ref 150–400)
RBC: 4.12 MIL/uL (ref 3.87–5.11)
RDW: 13.2 % (ref 11.5–15.5)
WBC: 6.1 10*3/uL (ref 4.0–10.5)
nRBC: 0 % (ref 0.0–0.2)

## 2021-06-16 LAB — RPR: RPR Ser Ql: NONREACTIVE

## 2021-06-16 LAB — TYPE AND SCREEN
ABO/RH(D): B POS
Antibody Screen: NEGATIVE

## 2021-06-16 MED ORDER — BUTORPHANOL TARTRATE 2 MG/ML IJ SOLN
1.0000 mg | Freq: Once | INTRAMUSCULAR | Status: AC
  Administered 2021-06-16: 1 mg via INTRAVENOUS
  Filled 2021-06-16: qty 0.5

## 2021-06-16 MED ORDER — SOD CITRATE-CITRIC ACID 500-334 MG/5ML PO SOLN: 30.0000 mL | ORAL | Status: DC | PRN

## 2021-06-16 MED ORDER — OXYTOCIN-SODIUM CHLORIDE 30-0.9 UT/500ML-% IV SOLN
INTRAVENOUS | Status: AC
  Administered 2021-06-16: 2 m[IU]/min via INTRAVENOUS
  Filled 2021-06-16: qty 500

## 2021-06-16 MED ORDER — SODIUM CHLORIDE 0.9 % IV SOLN
INTRAVENOUS | Status: AC
  Administered 2021-06-16: 2 g via INTRAVENOUS
  Filled 2021-06-16: qty 2000

## 2021-06-16 MED ORDER — LACTATED RINGERS IV SOLN: INTRAVENOUS | Status: DC

## 2021-06-16 MED ORDER — OXYTOCIN-SODIUM CHLORIDE 30-0.9 UT/500ML-% IV SOLN
2.5000 [IU]/h | INTRAVENOUS | Status: DC
  Administered 2021-06-17: 2.5 [IU]/h via INTRAVENOUS
  Filled 2021-06-16: qty 500

## 2021-06-16 MED ORDER — DIPHENHYDRAMINE HCL 50 MG/ML IJ SOLN
25.0000 mg | Freq: Once | INTRAMUSCULAR | Status: AC
  Administered 2021-06-16: 25 mg via INTRAVENOUS
  Filled 2021-06-16: qty 1

## 2021-06-16 MED ORDER — OXYCODONE-ACETAMINOPHEN 5-325 MG PO TABS: 1.0000 | ORAL_TABLET | ORAL | Status: DC | PRN

## 2021-06-16 MED ORDER — LIDOCAINE HCL (PF) 1 % IJ SOLN: 30.0000 mL | INTRAMUSCULAR | Status: DC | PRN

## 2021-06-16 MED ORDER — ACETAMINOPHEN 325 MG PO TABS: 650.0000 mg | ORAL_TABLET | ORAL | Status: DC | PRN

## 2021-06-16 MED ORDER — ONDANSETRON HCL 4 MG/2ML IJ SOLN
4.0000 mg | Freq: Four times a day (QID) | INTRAMUSCULAR | Status: DC | PRN
  Administered 2021-06-16 – 2021-06-17 (×2): 4 mg via INTRAVENOUS
  Filled 2021-06-16 (×2): qty 2

## 2021-06-16 MED ORDER — TERBUTALINE SULFATE 1 MG/ML IJ SOLN: 0.2500 mg | Freq: Once | INTRAMUSCULAR | Status: DC | PRN

## 2021-06-16 MED ORDER — AMMONIA AROMATIC IN INHA
RESPIRATORY_TRACT | Status: AC
  Filled 2021-06-16: qty 10

## 2021-06-16 MED ORDER — OXYCODONE-ACETAMINOPHEN 5-325 MG PO TABS: 2.0000 | ORAL_TABLET | ORAL | Status: DC | PRN

## 2021-06-16 MED ORDER — MISOPROSTOL 200 MCG PO TABS
ORAL_TABLET | ORAL | Status: AC
  Administered 2021-06-17: 800 ug via RECTAL
  Filled 2021-06-16: qty 4

## 2021-06-16 MED ORDER — SODIUM CHLORIDE 0.9 % IV SOLN: 2.0000 g | Freq: Once | INTRAVENOUS | Status: AC

## 2021-06-16 MED ORDER — LIDOCAINE HCL (PF) 1 % IJ SOLN
INTRAMUSCULAR | Status: AC
  Filled 2021-06-16: qty 30

## 2021-06-16 MED ORDER — OXYTOCIN BOLUS FROM INFUSION: 333.0000 mL | Freq: Once | INTRAVENOUS | Status: DC

## 2021-06-16 MED ORDER — OXYTOCIN-SODIUM CHLORIDE 30-0.9 UT/500ML-% IV SOLN
1.0000 m[IU]/min | INTRAVENOUS | Status: DC
  Administered 2021-06-17: 30 [IU] via INTRAVENOUS

## 2021-06-16 MED ORDER — LACTATED RINGERS IV SOLN: 500.0000 mL | INTRAVENOUS | Status: DC | PRN

## 2021-06-16 MED ORDER — OXYTOCIN 10 UNIT/ML IJ SOLN
INTRAMUSCULAR | Status: AC
  Filled 2021-06-16: qty 2

## 2021-06-16 MED ORDER — SODIUM CHLORIDE 0.9 % IV SOLN
1.0000 g | INTRAVENOUS | Status: DC
  Administered 2021-06-16 – 2021-06-17 (×4): 1 g via INTRAVENOUS
  Filled 2021-06-16 (×4): qty 1000

## 2021-06-16 NOTE — Progress Notes (Signed)
Rachel Simon is a 26 y.o. G1P0 at [redacted]w[redacted]d  Subjective: Doing well subjectively with contractions, praying and changing positions  Objective: BP 129/73 (BP Location: Right Arm)   Pulse 75   Temp 97.6 F (36.4 C) (Oral)   Resp 16   Ht 5' (1.524 m)   Wt 76 kg   LMP 09/05/2020 (Exact Date)   BMI 32.72 kg/m  No intake/output data recorded. No intake/output data recorded.  FHT:  FHR: 135 bpm, variability: moderate,  accelerations:  Present,  decelerations:  Present occasional late decels, resolved with position change UC:   irregular, every 2-4 minutes SVE:   Dilation: 10 Effacement (%): 100 Station: -1 Exam by:: [place] Guptill RN  Labs: Lab Results  Component Value Date   WBC 6.1 06/16/2021   HGB 11.8 (L) 06/16/2021   HCT 35.7 (L) 06/16/2021   MCV 86.7 06/16/2021   PLT 212 06/16/2021    Assessment / Plan: Pt continues to decline surgical intervention. They are aware that I am concerned about the baby's FHT with late decels and I'm worried about the placenta and fetal tolerance. She has been ruptured 8hrs, no s/s infection. We will continue to monitor. I recommend c/s again. They are aware that her baby should have moved down the birth canal by now, and the father of the baby states that we need to give it more time. I believe that if there were a prolonged late she would consent for urgent surgical delivery, but they have not consented as of this time. They did consent for an epidural but only if we had to do a c/s.   Christeen Douglas 06/16/2021, 11:18 PM

## 2021-06-16 NOTE — Progress Notes (Signed)
Labor Progress Note  Rachel Simon is a 26 y.o. G1P0 at [redacted]w[redacted]d by LMP admitted for active labor  Subjective: states she feels bad with contractions with pain rated 8/10.  Objective: BP 122/70 (BP Location: Right Arm)   Pulse 67   Temp 97.7 F (36.5 C) (Oral)   Resp 18   Ht 5' (1.524 m)   Wt 76 kg   LMP 09/05/2020 (Exact Date)   BMI 32.72 kg/m  Notable VS details:   Fetal Assessment: FHT:  FHR: 145 bpm, variability: moderate,  accelerations:  Present,  decelerations:  Absent Category/reactivity:  Category I UC:   regular, every 2-4 minutes SVE:   10/100/0 Membrane status: AROM @ 1521 Amniotic color: meconium  Labs: Lab Results  Component Value Date   WBC 6.1 06/16/2021   HGB 11.8 (L) 06/16/2021   HCT 35.7 (L) 06/16/2021   MCV 86.7 06/16/2021   PLT 212 06/16/2021    Assessment / Plan: Pitocin 31mil/units  AROM for moderate amount of meconium fluid  Labor:  will continue to titrate pitocin as appropriate Preeclampsia:  labs stable Fetal Wellbeing:  Category I Pain Control:  Labor support without medications I/D:  n/a Anticipated MOD:  NSVD  Lyza Houseworth LUCY LORENA Rawlin Reaume, CNM 06/16/2021, 3:43 PM

## 2021-06-16 NOTE — Progress Notes (Signed)
Labor Progress Note  Rachel Simon is a 26 y.o. G1P0 at [redacted]w[redacted]d by LMP admitted for active labor  Subjective: resting in bed with family at bedside  Objective: BP 122/70 (BP Location: Right Arm)   Pulse 67   Temp 97.7 F (36.5 C) (Oral)   Resp 18   Ht 5' (1.524 m)   Wt 76 kg   LMP 09/05/2020 (Exact Date)   BMI 32.72 kg/m  Notable VS details:   Fetal Assessment: FHT:  FHR: 145 bpm, variability: moderate,  accelerations:  Present,  decelerations:  Present prolonged deceleration to the  60;s Category/reactivity:  Category II UC:   regular, every 2-5 minutes SVE:    Membrane status: SROM Amniotic color: meconium  Labs: Lab Results  Component Value Date   WBC 6.1 06/16/2021   HGB 11.8 (L) 06/16/2021   HCT 35.7 (L) 06/16/2021   MCV 86.7 06/16/2021   PLT 212 06/16/2021    Assessment / Plan: Arrest of decentDiscussed with patient the recommendation of a C-Section at this time d/t  FHR and no progression in fetal decent in 10 hours. Patient declined at this time and stated she will not have a surgery no matter what. Patient is aware at this time of the risks to both herself and the fetus including fetal death.  Patient continues to decline medical recommendations at this time, stating God is here and with her and she needs more time for the baby to come on his own.  Dr. Dalbert Garnet present and at bedside Interpreter via [employer] Ipad.  Sanjay Broadfoot Wonda Amis, CNM 06/16/2021, 5:16 PM

## 2021-06-16 NOTE — OB Triage Note (Signed)
Pt arrives G1 P0 with c/o ctx's since 1400 Thursday and LOF since around 1000 Thursday. Pt speaks Swahili and is seen by the Health [facility]. Pt denies picking out practice for delivery.

## 2021-06-16 NOTE — Progress Notes (Signed)
19:45 into patient's room with interpreter  Cat I strip continues. On recheck, no fetal descent. No fetal caput or moulding. LOP position. Cervix palpable anteriorly. She has been changing positions.  We discussed the procedure of a c-section and that I recommend it now because of the fetal station and the time she has been dilated. She is interested in waiting longer, but is not sure how long and did not consent to a time limit. I confirmed that the fetal status is reassuring by monitor and that she doesn't have signs of infection.  She is calm through contractions though they are strong. Meconium noted.  Vitals:   06/16/21 1944 06/16/21 2012  BP:  (!) 120/54  Pulse:  76  Resp: 18   Temp: 98.5 F (36.9 C)     I plan to recheck in one hour unless fetal status changes.

## 2021-06-16 NOTE — Progress Notes (Addendum)
FHT now a category 1 tracing. 125 BPM, moderate variability, accelerations no deceleration present. Patient up on birthing ball.  Chari Manning CNM

## 2021-06-16 NOTE — Progress Notes (Signed)
Labor Progress Note  Rachel Simon is a 26 y.o. G1P0 at [redacted]w[redacted]d by LMP admitted for active labor  Subjective: Resting in bed with family at bedside, she states she is doing ok via interpreter  Objective: BP (!) 143/69 (BP Location: Right Arm)   Pulse 77   Temp 97.9 F (36.6 C) (Oral)   Resp 16   Ht 5' (1.524 m)   Wt 76 kg   LMP 09/05/2020 (Exact Date)   BMI 32.72 kg/m  Notable VS details:   Fetal Assessment: FHT:  FHR: 145 bpm, variability: moderate,  accelerations:  Present,  decelerations:  Present intermittent early Category/reactivity:  Category I UC:   regular, every 1.5-4 minutes SVE:   10/100/0 Membrane status: intact Amniotic color: intact  Labs: Lab Results  Component Value Date   WBC 6.1 06/16/2021   HGB 11.8 (L) 06/16/2021   HCT 35.7 (L) 06/16/2021   MCV 86.7 06/16/2021   PLT 212 06/16/2021    Assessment / Plan: Spontaneous labor, progressing normally Membranes intact, patient desires SROM Labor: Progressing normally Preeclampsia:   N/A Fetal Wellbeing:  Category I Pain Control:  Labor support without medications I/D:   GBS unknown treated with antibiotics x 1 Anticipated MOD:  NSVD  Ares Cardozo LUCY LORENA Detavious Rinn, CNM 06/16/2021, 8:33 AM

## 2021-06-16 NOTE — Progress Notes (Signed)
Note delayed due to patient care; assuming care. She was seen with the Language Line interpreter, with her female family member at the bedside and her partner behind a curtain.  She did have one ultrasound this pregnancy on 03/30/21 at 31wks with normal anatomy and EFW 95%. Initial prenatal visit at 24wks with [facility].  26yo G1P0 at [redacted]w[redacted]d who presented in active labor 12 hours ago. She has been fully dilated since at least 8am. She has declined intervention, including AROM and pitocin, during the day. However, she did consent to AROM at 1521 for meconium stained fluid, which is still true. She does not have an epidural. We started pitocin to assist with contraction frequency.  I was called to the bedside for decel at 16:28 and found a deep decel to the 60s. It improved with conservative measures, and was down for 6 minutes. She has been Cat I for the majority of the day and the baby did recover appropriately.  With the interpreter, I discussed with the patient her delayed second stage, and that I am concerned that the baby is not coming vaginally. Her husband was also in the room, and I spoke with them both. I strongly recommend a c-section. I confirmed that I would not perform surgery, even to save her baby's life, without her explicit consent. Through her husband and the interpreter, she said "God is present. We will wait on God's time." I affirmed this faith, and detailed that if her baby's heart rate dropped again, I would want to perform a stat c-section to save his life, and I asked if that would be ok in this emergent situation. She responded, "All things are done in God's time. So we wait." The recovery on the monitor was Cat I at this time. I offered her iv pain meds and an epidural - she assented to iv meds, and if we deliver soon I will make sure the peds team is aware. I also recommended she consider an epidural so that if we needed to proceed to the OR she would have good pain control (I did not  mention that without an epidural she would need general anesthesia in an urgent situation at this time, as the idea of surgery is concerning by itself and I do not want her to feel that her desires are being disregarded.) I asked if we could put a time limit on waiting, and if I could recheck in 1 hour. She did not agree.  Unfortunately the fetal station is -1.    She remains cat I. We are proceeding with spinning babies positioning, and I will speak with her again. All questions answered from her husband and patient.  Of note and if needed: Client's husband signed Tour manager form at her new OB visit, in the event he needs to assist in interpretation, which was witnessed by [employer]. Client was born in the [place] and has been in the [place] x6 years, first arriving as a refuge.

## 2021-06-16 NOTE — Progress Notes (Signed)
To patient bedside for assessment. Cervix now 9/90/-2 with the fetal head applied but not lower. With interpreter, she confirmed the omnipotence of God and asked that she be given more time. I asked patient if there were situations where a c/s would be acceptable, and she said not yet. I confirmed that her baby has mod var with accels and occasional decels, and that her temp is normal. I also said that with this amount of time, the baby should be lower and that I'm concerned he won't fit through her pelvis. She is calm and animated. We decided that I will recheck her in 1 hour. She is moving and swaying comfortably. Thick meconium noted.   Vitals:   06/16/21 1216 06/16/21 1705  BP: 122/70   Pulse: 67   Resp: 18   Temp: 97.7 F (36.5 C) 98.2 F (36.8 C)    Her BP most recently was 142/70. Urine output adequate.

## 2021-06-16 NOTE — H&P (Signed)
OB History & Physical   History of Present Illness:  Chief Complaint:   HPI:  Mirtie A. Stradling is a 26 y.o. G1P0 female at [redacted]w[redacted]d dated by LMP of 09/05/20.  She presents to L&D for active labor  She reports:  -active fetal movement -LOF/SROM at around 1000  -no vaginal bleeding -onset of contractions at 1400 currently every 4 minutes  Pregnancy Issues: 1. Late to prenatal care 2. Anemia 3. Diastolic heart murmur   Maternal Medical History:   Past Medical History:  Diagnosis Date   Anemia 02/23/2021   Heart murmur 02/23/2021   IDA (iron deficiency anemia)     No past surgical history on file.  No Known Allergies  Prior to Admission medications   Medication Sig Start Date End Date Taking? Authorizing Provider  Prenatal Vit-Fe Fumarate-FA (PRENATAL VITAMIN) 27-0.8 MG TABS Take 1 tablet by mouth daily at 6 (six) AM. 02/23/21   Newton, Kimberly Niles, MD     Prenatal care site: [facility]   Social History: She  reports that she has never smoked. She has never been exposed to tobacco smoke. She has never used smokeless tobacco. She reports that she does not drink alcohol and does not use drugs.  Family History: family history includes GI problems in her father.   Review of Systems: A full review of systems was performed and negative except as noted in the HPI.    Physical Exam:  Vital Signs: BP 125/80 (BP Location: Left Arm)   Pulse 75   Temp 98.5 F (36.9 C) (Oral)   Resp 18   Ht 5' (1.524 m)   Wt 76 kg   LMP 09/05/2020 (Exact Date)   BMI 32.72 kg/m   General:   alert and cooperative  Skin:  normal  Neurologic:    Alert & oriented x 3  Lungs:    Nl effort  Heart:   regular rate and rhythm  Abdomen:  soft, non-tender; bowel sounds normal; no masses,  no organomegaly  Extremities: : non-tender, symmetric, no edema bilaterally.       Pertinent Results:  Prenatal Labs: Blood type/Rh B pos  Antibody screen neg  Rubella MMR x2           (06/03/14, 11/18/14)  Varicella Varivax x2        (11/18/14, 01/27/15)  RPR NR  HBsAg Neg  HIV NR  GC neg  Chlamydia neg  Genetic screening   1 hour GTT 97  3 hour GTT   GBS    FHT: FHR: 135 bpm, variability: moderate,  accelerations:  Present,  decelerations:  Absent Category/reactivity:  Category I TOCO: regular, every 4 minutes SVE:   /   /       ECHOCARDIOGRAM COMPLETE  Result Date: 05/18/2021    ECHOCARDIOGRAM REPORT   Patient Name:   Adylynn A. Prosser Date of Exam: 05/18/2021 Medical Rec #:  [id number]            Height:       60.0 in Accession #:    [id number]           Weight:       164.0 lb Date of Birth:  02/26/1995             BSA:          1.716 m Patient Age:    26 years             BP:             99/60 mmHg Patient Gender: F                    HR:           74 bpm. Exam Location:  [facility] Procedure: 2D Echo, Cardiac Doppler and Color Doppler Indications:     Murmur R01.1  History:         Patient has no prior history of Echocardiogram examinations.                  CHF, COPD, Signs/Symptoms:Murmur; Risk Factors:Diabetes.  Sonographer:     Jerry Hege Referring Phys:  [id number] ELIZABETH A SCIORA Diagnosing Phys: Shaukat Khan IMPRESSIONS  1. Left ventricular ejection fraction, by estimation, is 60 to 65%. The left ventricle has normal function. The left ventricle has no regional wall motion abnormalities. There is moderate concentric left ventricular hypertrophy. Left ventricular diastolic parameters are consistent with Grade I diastolic dysfunction (impaired relaxation).  2. Right ventricular systolic function is normal. The right ventricular size is normal.  3. The mitral valve is normal in structure. Trivial mitral valve regurgitation. No evidence of mitral stenosis.  4. The aortic valve is normal in structure. Aortic valve regurgitation is not visualized. No aortic stenosis is present.  5. The inferior vena cava is normal in size with greater than 50% respiratory variability, suggesting  right atrial pressure of 3 mmHg. FINDINGS  Left Ventricle: Left ventricular ejection fraction, by estimation, is 60 to 65%. The left ventricle has normal function. The left ventricle has no regional wall motion abnormalities. The left ventricular internal cavity size was normal in size. There is  moderate concentric left ventricular hypertrophy. Left ventricular diastolic parameters are consistent with Grade I diastolic dysfunction (impaired relaxation). Right Ventricle: The right ventricular size is normal. No increase in right ventricular wall thickness. Right ventricular systolic function is normal. Left Atrium: Left atrial size was normal in size. Right Atrium: Right atrial size was normal in size. Pericardium: There is no evidence of pericardial effusion. Mitral Valve: The mitral valve is normal in structure. Trivial mitral valve regurgitation. No evidence of mitral valve stenosis. MV peak gradient, 5.6 mmHg. The mean mitral valve gradient is 2.0 mmHg. Tricuspid Valve: The tricuspid valve is normal in structure. Tricuspid valve regurgitation is mild . No evidence of tricuspid stenosis. Aortic Valve: The aortic valve is normal in structure. Aortic valve regurgitation is not visualized. No aortic stenosis is present. Aortic valve mean gradient measures 4.0 mmHg. Aortic valve peak gradient measures 7.0 mmHg. Aortic valve area, by VTI measures 1.94 cm. Pulmonic Valve: The pulmonic valve was normal in structure. Pulmonic valve regurgitation is not visualized. No evidence of pulmonic stenosis. Aorta: The aortic root is normal in size and structure. Venous: The inferior vena cava is normal in size with greater than 50% respiratory variability, suggesting right atrial pressure of 3 mmHg. IAS/Shunts: No atrial level shunt detected by color flow Doppler.  LEFT VENTRICLE PLAX 2D LVIDd:         4.80 cm   Diastology LVIDs:         3.10 cm   LV e' medial:    10.00 cm/s LV PW:         1.00 cm   LV E/e' medial:  9.6 LV IVS:         0.75 cm   LV e' lateral:   12.50 cm/s LVOT diam:     1.90 cm   LV E/e' lateral: 7.7 LV SV:           49 LV SV Index:   29 LVOT Area:     2.84 cm  RIGHT VENTRICLE RV Basal diam:  3.60 cm RV S prime:     14.70 cm/s TAPSE (M-mode): 3.4 cm LEFT ATRIUM             Index        RIGHT ATRIUM           Index LA diam:        2.50 cm 1.46 cm/m   RA Area:     11.70 cm LA Vol (A2C):   39.8 ml 23.20 ml/m  RA Volume:   27.10 ml  15.80 ml/m LA Vol (A4C):   16.7 ml 9.73 ml/m LA Biplane Vol: 25.9 ml 15.10 ml/m  AORTIC VALVE                    PULMONIC VALVE AV Area (Vmax):    1.80 cm     PV Vmax:          1.10 m/s AV Area (Vmean):   1.63 cm     PV Vmean:         71.200 cm/s AV Area (VTI):     1.94 cm     PV VTI:           0.238 m AV Vmax:           132.00 cm/s  PV Peak grad:     4.8 mmHg AV Vmean:          86.733 cm/s  PV Mean grad:     2.0 mmHg AV VTI:            0.254 m      PR End Diast Vel: 2.15 msec AV Peak Grad:      7.0 mmHg     RVOT Peak grad:   10 mmHg AV Mean Grad:      4.0 mmHg LVOT Vmax:         83.60 cm/s LVOT Vmean:        49.900 cm/s LVOT VTI:          0.174 m LVOT/AV VTI ratio: 0.69  AORTA Ao Root diam: 2.55 cm MITRAL VALVE               TRICUSPID VALVE MV Area (PHT): 3.11 cm    TR Peak grad:   12.1 mmHg MV Area VTI:   1.46 cm    TR Vmax:        174.00 cm/s MV Peak grad:  5.6 mmHg MV Mean grad:  2.0 mmHg    SHUNTS MV Vmax:       1.18 m/s    Systemic VTI:  0.17 m MV Vmean:      68.1 cm/s   Systemic Diam: 1.90 cm MV Decel Time: 244 msec    Pulmonic VTI:  0.324 m MV E velocity: 96.00 cm/s MV A velocity: 58.30 cm/s MV E/A ratio:  1.65 Shaukat Khan Electronically signed by Neoma Laming Signature Date/Time: 05/18/2021/2:59:05 PM    Final      Assessment:  Roger Shelter. Pinela is a 26 y.o. G1P0 female at 15w4dwith active labor.   Plan:  1. Admit to Labor & Delivery; consents reviewed and obtained  2. Fetal Well being  - Fetal Tracing: Cat I - GBS unknown - Presentation: vtx confirmed by sve and  BSUS   3. Routine OB: - Prenatal labs reviewed, as above - Rh pos - CBC &  T&S on admit - Clear fluids, IVF  4. Monitoring of Labor -  Contractions by external toco in place -  Plan for continuous fetal monitoring  -  Maternal pain control as desired: IVPM, nitrous, regional anesthesia - Anticipate vaginal delivery  5. Post Partum Planning: - Infant feeding: Breast and bottle - Contraception: Does not want BC - Tdap: given 04/17/21 - Flu: given 02/23/21  JENNIFER OXLEY, CNM 06/16/2021 5:04 AM    

## 2021-06-16 NOTE — Progress Notes (Signed)
RN came from the room after repositioning, and family would like to wait until 10pm, and recheck at that time. Cat I strip continues, will continue to monitor.

## 2021-06-16 NOTE — Progress Notes (Signed)
Labor Progress Note  Rachel Simon is a 26 y.o. G1P0 at [redacted]w[redacted]d by LMP admitted for active labor  Subjective: Patient resting in bed with family at bedside  Objective: BP 122/70 (BP Location: Right Arm)   Pulse 67   Temp 97.7 F (36.5 C) (Oral)   Resp 18   Ht 5' (1.524 m)   Wt 76 kg   LMP 09/05/2020 (Exact Date)   BMI 32.72 kg/m  Notable VS details:   Fetal Assessment: FHT:  FHR: 145 bpm, variability: moderate,  accelerations:  Present,  decelerations:  Present early Category/reactivity:  Category I UC:   irregular, every 4-8  minutes SVE:   100/10/-0 Membrane status: intact Amniotic color: intact  Labs: Lab Results  Component Value Date   WBC 6.1 06/16/2021   HGB 11.8 (L) 06/16/2021   HCT 35.7 (L) 06/16/2021   MCV 86.7 06/16/2021   PLT 212 06/16/2021    Assessment / Plan: -Discussed with patient medical interventions for labor and delivery. Patient has been complete for 6 hours now without the urge to push or SROM as patient desired. Patient amendable to now starting pitocin to get get contractions in a good labor pattern. Patient also agreed to AROM  at this time.  -Start Pitocin -AROM -Re-evaluate contraction pattern  Labor:  slow progression from complete dilation to patient having the spontaneous urge to push Preeclampsia:  labs stable Fetal Wellbeing:  Category II Pain Control:  Labor support without medications I/D:   GBS unknown treated x2 Anticipated MOD:  NSVD  Dr. Dalbert Garnet aware  Chari Manning CNM

## 2021-06-16 NOTE — Progress Notes (Signed)
Labor Progress Note  Rachel Simon is a 26 y.o. G1P0 at 102w4d by LMP admitted for active labor  Subjective: resting in bed after warm shower  Objective: BP 129/73 (BP Location: Right Arm)   Pulse 75   Temp 97.6 F (36.4 C) (Oral)   Resp 16   Ht 5' (1.524 m)   Wt 76 kg   LMP 09/05/2020 (Exact Date)   BMI 32.72 kg/m  Notable VS details:   Fetal Assessment: FHT:  FHR: 145 bpm, variability: moderate,  accelerations:  Present,  decelerations:  Present late Decelerations Category/reactivity:  Category II UC:   regular, every 1.5-4 minutes SVE: 7/80/-2   Membrane status: SROM Amniotic color: Meconium  Labs: Lab Results  Component Value Date   WBC 6.1 06/16/2021   HGB 11.8 (L) 06/16/2021   HCT 35.7 (L) 06/16/2021   MCV 86.7 06/16/2021   PLT 212 06/16/2021    Assessment / Plan: Patient's cervix now 7/80/-2, Patient and spouse asked for privacy as they discuss the possibility of a c- section. FOB states he will put the light on when they are ready for a discussion.    Valentine, [place] 06/16/2021, 10:59 PM

## 2021-06-17 ENCOUNTER — Encounter
Admission: EM | Disposition: A | Discharge status: Home / Self Care | Payer: Self-pay | Source: Home / Self Care | Attending: Obstetrics and Gynecology

## 2021-06-17 ENCOUNTER — Encounter: Payer: Self-pay | Admitting: Obstetrics and Gynecology

## 2021-06-17 ENCOUNTER — Inpatient Hospital Stay: Payer: Medicaid Other | Admitting: Anesthesiology

## 2021-06-17 DIAGNOSIS — Z3403 Encounter for supervision of normal first pregnancy, third trimester: Secondary | ICD-10-CM | POA: Diagnosis not present

## 2021-06-17 LAB — CBC
HCT: 35.1 % — ABNORMAL LOW (ref 36.0–46.0)
Hemoglobin: 11.7 g/dL — ABNORMAL LOW (ref 12.0–15.0)
MCH: 29.4 pg (ref 26.0–34.0)
MCHC: 33.3 g/dL (ref 30.0–36.0)
MCV: 88.2 fL (ref 80.0–100.0)
Platelets: 184 10*3/uL (ref 150–400)
RBC: 3.98 MIL/uL (ref 3.87–5.11)
RDW: 13.2 % (ref 11.5–15.5)
WBC: 12.1 10*3/uL — ABNORMAL HIGH (ref 4.0–10.5)
nRBC: 0 % (ref 0.0–0.2)

## 2021-06-17 LAB — CREATININE, SERUM
Creatinine, Ser: 0.71 mg/dL (ref 0.44–1.00)
GFR, Estimated: >60 mL/min (ref >60)

## 2021-06-17 SURGERY — Surgical Case
Anesthesia: Spinal

## 2021-06-17 MED ORDER — IBUPROFEN 600 MG PO TABS
600.0000 mg | ORAL_TABLET | Freq: Four times a day (QID) | ORAL | Status: DC
  Administered 2021-06-18 – 2021-06-19 (×6): 600 mg via ORAL
  Filled 2021-06-17 (×6): qty 1

## 2021-06-17 MED ORDER — FERROUS SULFATE 325 (65 FE) MG PO TABS
325.0000 mg | ORAL_TABLET | Freq: Two times a day (BID) | ORAL | Status: DC
  Administered 2021-06-17 – 2021-06-19 (×5): 325 mg via ORAL
  Filled 2021-06-17 (×5): qty 1

## 2021-06-17 MED ORDER — METHYLERGONOVINE MALEATE 0.2 MG/ML IJ SOLN
0.2000 mg | Freq: Once | INTRAMUSCULAR | Status: AC
  Administered 2021-06-17: 0.2 mg via INTRAMUSCULAR

## 2021-06-17 MED ORDER — NALOXONE HCL 0.4 MG/ML IJ SOLN: 0.4000 mg | INTRAMUSCULAR | Status: DC | PRN

## 2021-06-17 MED ORDER — MISOPROSTOL 200 MCG PO TABS: 800.0000 ug | ORAL_TABLET | Freq: Once | ORAL | Status: AC

## 2021-06-17 MED ORDER — MEPERIDINE HCL 25 MG/ML IJ SOLN: 6.2500 mg | INTRAMUSCULAR | Status: DC | PRN

## 2021-06-17 MED ORDER — SOD CITRATE-CITRIC ACID 500-334 MG/5ML PO SOLN
ORAL | Status: AC
Start: 2021-06-17 — End: 2021-06-17
  Administered 2021-06-17: 30 mL via ORAL
  Filled 2021-06-17: qty 15

## 2021-06-17 MED ORDER — FLEET ENEMA 7-19 GM/118ML RE ENEM: 1.0000 | ENEMA | Freq: Every day | RECTAL | Status: DC | PRN

## 2021-06-17 MED ORDER — DIBUCAINE (PERIANAL) 1 % EX OINT: 1.0000 "application " | TOPICAL_OINTMENT | CUTANEOUS | Status: DC | PRN

## 2021-06-17 MED ORDER — PRENATAL MULTIVITAMIN CH
1.0000 | ORAL_TABLET | Freq: Every day | ORAL | Status: DC
  Administered 2021-06-17 – 2021-06-19 (×3): 1 via ORAL
  Filled 2021-06-17 (×3): qty 1

## 2021-06-17 MED ORDER — SENNOSIDES-DOCUSATE SODIUM 8.6-50 MG PO TABS
2.0000 | ORAL_TABLET | ORAL | Status: DC
  Administered 2021-06-17 – 2021-06-18 (×2): 2 via ORAL
  Filled 2021-06-17 (×2): qty 2

## 2021-06-17 MED ORDER — ONDANSETRON HCL 4 MG/2ML IJ SOLN: 4.0000 mg | Freq: Three times a day (TID) | INTRAMUSCULAR | Status: DC | PRN

## 2021-06-17 MED ORDER — BISACODYL 10 MG RE SUPP: 10.0000 mg | Freq: Every day | RECTAL | Status: DC | PRN

## 2021-06-17 MED ORDER — DEXAMETHASONE SODIUM PHOSPHATE 10 MG/ML IJ SOLN
INTRAMUSCULAR | Status: DC | PRN
  Administered 2021-06-17: 10 mg via INTRAVENOUS

## 2021-06-17 MED ORDER — BUPIVACAINE HCL (PF) 0.5 % IJ SOLN
INTRAMUSCULAR | Status: AC
  Filled 2021-06-17: qty 30

## 2021-06-17 MED ORDER — PHENYLEPHRINE HCL-NACL 20-0.9 MG/250ML-% IV SOLN
INTRAVENOUS | Status: DC | PRN
  Administered 2021-06-17: 50 ug/min via INTRAVENOUS

## 2021-06-17 MED ORDER — SOD CITRATE-CITRIC ACID 500-334 MG/5ML PO SOLN: 30.0000 mL | ORAL | Status: DC

## 2021-06-17 MED ORDER — OXYCODONE HCL 5 MG PO TABS: 5.0000 mg | ORAL_TABLET | ORAL | Status: DC | PRN

## 2021-06-17 MED ORDER — DIPHENHYDRAMINE HCL 50 MG/ML IJ SOLN: 12.5000 mg | INTRAMUSCULAR | Status: DC | PRN

## 2021-06-17 MED ORDER — LACTATED RINGERS IV SOLN: INTRAVENOUS | Status: DC

## 2021-06-17 MED ORDER — CEFAZOLIN SODIUM-DEXTROSE 2-3 GM-%(50ML) IV SOLR
INTRAVENOUS | Status: DC | PRN
Start: 2021-06-17 — End: 2021-06-17
  Administered 2021-06-17: 2 g via INTRAVENOUS

## 2021-06-17 MED ORDER — ACETAMINOPHEN 500 MG PO TABS
1000.0000 mg | ORAL_TABLET | Freq: Four times a day (QID) | ORAL | Status: DC
  Filled 2021-06-17: qty 2

## 2021-06-17 MED ORDER — CEFAZOLIN SODIUM-DEXTROSE 2-4 GM/100ML-% IV SOLN
2.0000 g | INTRAVENOUS | Status: DC
  Filled 2021-06-17: qty 100

## 2021-06-17 MED ORDER — DIPHENHYDRAMINE HCL 25 MG PO CAPS: 25.0000 mg | ORAL_CAPSULE | ORAL | Status: DC | PRN

## 2021-06-17 MED ORDER — OXYTOCIN-SODIUM CHLORIDE 30-0.9 UT/500ML-% IV SOLN
2.5000 [IU]/h | INTRAVENOUS | Status: AC
  Administered 2021-06-17 (×2): 2.5 [IU]/h via INTRAVENOUS
  Filled 2021-06-17: qty 500

## 2021-06-17 MED ORDER — WITCH HAZEL-GLYCERIN EX PADS: 1.0000 "application " | MEDICATED_PAD | CUTANEOUS | Status: DC | PRN

## 2021-06-17 MED ORDER — MORPHINE SULFATE (PF) 0.5 MG/ML IJ SOLN
INTRAMUSCULAR | Status: DC | PRN
  Administered 2021-06-17: .15 mg via INTRATHECAL

## 2021-06-17 MED ORDER — NALOXONE HCL 4 MG/10ML IJ SOLN: 1.0000 ug/kg/h | INTRAVENOUS | Status: DC | PRN

## 2021-06-17 MED ORDER — GABAPENTIN 300 MG PO CAPS
300.0000 mg | ORAL_CAPSULE | Freq: Every day | ORAL | Status: DC
  Administered 2021-06-17 – 2021-06-18 (×2): 300 mg via ORAL
  Filled 2021-06-17 (×2): qty 1

## 2021-06-17 MED ORDER — SIMETHICONE 80 MG PO CHEW: 80.0000 mg | CHEWABLE_TABLET | ORAL | Status: DC | PRN

## 2021-06-17 MED ORDER — METHYLERGONOVINE MALEATE 0.2 MG PO TABS
0.2000 mg | ORAL_TABLET | Freq: Four times a day (QID) | ORAL | Status: AC
  Administered 2021-06-17 – 2021-06-18 (×4): 0.2 mg via ORAL
  Filled 2021-06-17 (×6): qty 1

## 2021-06-17 MED ORDER — KETOROLAC TROMETHAMINE 30 MG/ML IJ SOLN
INTRAMUSCULAR | Status: AC
  Filled 2021-06-17: qty 1

## 2021-06-17 MED ORDER — MEASLES, MUMPS & RUBELLA VAC IJ SOLR
0.5000 mL | Freq: Once | INTRAMUSCULAR | Status: DC
  Filled 2021-06-17: qty 0.5

## 2021-06-17 MED ORDER — KETOROLAC TROMETHAMINE 30 MG/ML IJ SOLN
30.0000 mg | Freq: Four times a day (QID) | INTRAMUSCULAR | Status: AC | PRN
  Administered 2021-06-17: 30 mg via INTRAVENOUS

## 2021-06-17 MED ORDER — SODIUM CHLORIDE (PF) 0.9 % IJ SOLN
INTRAMUSCULAR | Status: DC | PRN
  Administered 2021-06-17: 20 mL via INTRAVENOUS

## 2021-06-17 MED ORDER — SIMETHICONE 80 MG PO CHEW
80.0000 mg | CHEWABLE_TABLET | Freq: Three times a day (TID) | ORAL | Status: DC
  Administered 2021-06-17 – 2021-06-19 (×5): 80 mg via ORAL
  Filled 2021-06-17 (×6): qty 1

## 2021-06-17 MED ORDER — KETOROLAC TROMETHAMINE 30 MG/ML IJ SOLN
30.0000 mg | Freq: Four times a day (QID) | INTRAMUSCULAR | Status: AC
  Administered 2021-06-17 – 2021-06-18 (×2): 30 mg via INTRAVENOUS
  Filled 2021-06-17 (×4): qty 1

## 2021-06-17 MED ORDER — BUPIVACAINE LIPOSOME 1.3 % IJ SUSP
INTRAMUSCULAR | Status: AC
  Filled 2021-06-17: qty 20

## 2021-06-17 MED ORDER — SODIUM CHLORIDE 0.9% FLUSH: 3.0000 mL | INTRAVENOUS | Status: DC | PRN

## 2021-06-17 MED ORDER — ACETAMINOPHEN 500 MG PO TABS
1000.0000 mg | ORAL_TABLET | Freq: Four times a day (QID) | ORAL | Status: AC
  Administered 2021-06-17 (×3): 1000 mg via ORAL
  Filled 2021-06-17 (×2): qty 2

## 2021-06-17 MED ORDER — MENTHOL 3 MG MT LOZG: 1.0000 | LOZENGE | OROMUCOSAL | Status: DC | PRN

## 2021-06-17 MED ORDER — CHLORHEXIDINE GLUCONATE 0.12 % MT SOLN
OROMUCOSAL | Status: AC
  Filled 2021-06-17: qty 15

## 2021-06-17 MED ORDER — KETOROLAC TROMETHAMINE 30 MG/ML IJ SOLN
INTRAMUSCULAR | Status: DC | PRN
  Administered 2021-06-17: 30 mg via INTRAVENOUS

## 2021-06-17 MED ORDER — MORPHINE SULFATE (PF) 0.5 MG/ML IJ SOLN
INTRAMUSCULAR | Status: AC
  Filled 2021-06-17: qty 10

## 2021-06-17 MED ORDER — FENTANYL CITRATE (PF) 100 MCG/2ML IJ SOLN
INTRAMUSCULAR | Status: DC | PRN
  Administered 2021-06-17: 10 ug via INTRATHECAL

## 2021-06-17 MED ORDER — ONDANSETRON HCL 4 MG/2ML IJ SOLN
INTRAMUSCULAR | Status: DC | PRN
  Administered 2021-06-17: 4 mg via INTRAVENOUS

## 2021-06-17 MED ORDER — TRANEXAMIC ACID-NACL 1000-0.7 MG/100ML-% IV SOLN
INTRAVENOUS | Status: DC | PRN
  Administered 2021-06-17: 1000 mg via INTRAVENOUS

## 2021-06-17 MED ORDER — BUPIVACAINE HCL (PF) 0.5 % IJ SOLN
INTRAMUSCULAR | Status: DC | PRN
  Administered 2021-06-17: 60 mL

## 2021-06-17 MED ORDER — 0.9 % SODIUM CHLORIDE (POUR BTL) OPTIME
TOPICAL | Status: DC | PRN
  Administered 2021-06-17: 300 mL

## 2021-06-17 MED ORDER — SODIUM CHLORIDE (PF) 0.9 % IJ SOLN
INTRAMUSCULAR | Status: AC
  Filled 2021-06-17: qty 50

## 2021-06-17 MED ORDER — FENTANYL CITRATE (PF) 100 MCG/2ML IJ SOLN
INTRAMUSCULAR | Status: AC
  Filled 2021-06-17: qty 2

## 2021-06-17 MED ORDER — SCOPOLAMINE 1 MG/3DAYS TD PT72
1.0000 | MEDICATED_PATCH | Freq: Once | TRANSDERMAL | Status: DC
  Filled 2021-06-17: qty 1

## 2021-06-17 MED ORDER — TRANEXAMIC ACID-NACL 1000-0.7 MG/100ML-% IV SOLN
INTRAVENOUS | Status: AC
Start: 2021-06-17 — End: 2021-06-17
  Filled 2021-06-17: qty 100

## 2021-06-17 MED ORDER — BUTORPHANOL TARTRATE 1 MG/ML IJ SOLN: 1.0000 mg | Freq: Once | INTRAMUSCULAR | Status: DC

## 2021-06-17 MED ORDER — METHYLERGONOVINE MALEATE 0.2 MG/ML IJ SOLN
INTRAMUSCULAR | Status: AC
  Filled 2021-06-17: qty 1

## 2021-06-17 MED ORDER — COCONUT OIL OIL
1.0000 "application " | TOPICAL_OIL | Status: DC | PRN
  Administered 2021-06-17: 1 via TOPICAL
  Filled 2021-06-17: qty 120

## 2021-06-17 MED ORDER — ENOXAPARIN SODIUM 40 MG/0.4ML IJ SOSY
40.0000 mg | PREFILLED_SYRINGE | INTRAMUSCULAR | Status: AC
  Administered 2021-06-18: 40 mg via SUBCUTANEOUS
  Filled 2021-06-17: qty 0.4

## 2021-06-17 MED ORDER — DIPHENHYDRAMINE HCL 25 MG PO CAPS: 25.0000 mg | ORAL_CAPSULE | Freq: Four times a day (QID) | ORAL | Status: DC | PRN

## 2021-06-17 MED ORDER — SODIUM CHLORIDE 0.9 % IV SOLN
500.0000 mg | INTRAVENOUS | Status: AC
  Administered 2021-06-17: 500 mg via INTRAVENOUS
  Filled 2021-06-17: qty 5

## 2021-06-17 MED ORDER — BUPIVACAINE IN DEXTROSE 0.75-8.25 % IT SOLN
INTRATHECAL | Status: DC | PRN
  Administered 2021-06-17: 1.4 mL via INTRATHECAL

## 2021-06-17 MED ORDER — TETANUS-DIPHTH-ACELL PERTUSSIS 5-2.5-18.5 LF-MCG/0.5 IM SUSY
0.5000 mL | PREFILLED_SYRINGE | Freq: Once | INTRAMUSCULAR | Status: DC
  Filled 2021-06-17: qty 0.5

## 2021-06-17 MED ORDER — OXYCODONE HCL 5 MG PO TABS
5.0000 mg | ORAL_TABLET | ORAL | Status: AC | PRN
  Filled 2021-06-17: qty 1

## 2021-06-17 SURGICAL SUPPLY — 32 items
BACTOSHIELD CHG 4% 4OZ (MISCELLANEOUS) ×1
CHLORAPREP W/TINT 26 (MISCELLANEOUS) ×2 IMPLANT
DRSG CURAFIL 4X4 STRL (GAUZE/BANDAGES/DRESSINGS) IMPLANT
DRSG PAD ABDOMINAL 8X10 ST (GAUZE/BANDAGES/DRESSINGS) ×1 IMPLANT
DRSG TELFA 3X8 NADH (GAUZE/BANDAGES/DRESSINGS) ×2 IMPLANT
ELECT REM PT RETURN 9FT ADLT (ELECTROSURGICAL) ×2
ELECTRODE REM PT RTRN 9FT ADLT (ELECTROSURGICAL) ×1 IMPLANT
GAUZE CURAFIL 4X4 (GAUZE/BANDAGES/DRESSINGS) IMPLANT
GAUZE SPONGE 4X4 12PLY STRL (GAUZE/BANDAGES/DRESSINGS) ×2 IMPLANT
GOWN STRL REUS W/ TWL LRG LVL3 (GOWN DISPOSABLE) ×3 IMPLANT
GOWN STRL REUS W/TWL LRG LVL3 (GOWN DISPOSABLE) ×3
MANIFOLD NEPTUNE II (INSTRUMENTS) ×2 IMPLANT
MAT PREVALON FULL STRYKER (MISCELLANEOUS) ×2 IMPLANT
NDL HYPO 25GX1X1/2 BEV (NEEDLE) ×1 IMPLANT
NEEDLE HYPO 25GX1X1/2 BEV (NEEDLE) ×2 IMPLANT
NS IRRIG 1000ML POUR BTL (IV SOLUTION) ×2 IMPLANT
PACK C SECTION AR (MISCELLANEOUS) ×2 IMPLANT
PAD DRESSING TELFA 3X8 NADH (GAUZE/BANDAGES/DRESSINGS) ×1 IMPLANT
PAD OB MATERNITY 4.3X12.25 (PERSONAL CARE ITEMS) ×2 IMPLANT
PAD PREP 24X41 OB/GYN DISP (PERSONAL CARE ITEMS) ×2 IMPLANT
SCRUB CHG 4% DYNA-HEX 4OZ (MISCELLANEOUS) ×1 IMPLANT
STAPLER INSORB 30 2030 C-SECTI (MISCELLANEOUS) ×1 IMPLANT
SUT MNCRL 4-0 (SUTURE) ×1
SUT MNCRL 4-0 27XMFL (SUTURE) ×1
SUT VIC AB 0 CT1 36 (SUTURE) ×4 IMPLANT
SUT VIC AB 0 CTX 36 (SUTURE) ×2
SUT VIC AB 0 CTX36XBRD ANBCTRL (SUTURE) ×2 IMPLANT
SUT VIC AB 2-0 SH 27 (SUTURE) ×2
SUT VIC AB 2-0 SH 27XBRD (SUTURE) ×2 IMPLANT
SUTURE MNCRL 4-0 27XMF (SUTURE) ×1 IMPLANT
SYR 30ML LL (SYRINGE) ×4 IMPLANT
WATER STERILE IRR 500ML POUR (IV SOLUTION) ×2 IMPLANT

## 2021-06-17 NOTE — Progress Notes (Signed)
Rachel Simon is a 26 y.o. G1P0 at [redacted]w[redacted]d Pt seen with interpreter audio  Subjective: Lying in bed on side, in significant pain with contractions, sleeping quietly between. Pushing involuntarily with contractions.  Objective: BP 114/67 (BP Location: Right Arm)   Pulse 98   Temp 98.1 F (36.7 C) (Oral)   Resp 18   Ht 5' (1.524 m)   Wt 76 kg   LMP 09/05/2020 (Exact Date)   BMI 32.72 kg/m  No intake/output data recorded. No intake/output data recorded.  FHT:  FHR: 150 bpm, variability: moderate,  accelerations:  Present,  decelerations:  Present occasional late decels, resolved with position change , continued accels intermittently, occasional late decels, occasional variable decels UC:   irregular, every 2-4 minutes with coupling SVE:   Dilation: 9 Effacement (%): 70 Station: -2, -3 Exam by:: [employer] RN  Labs: Lab Results  Component Value Date   WBC 6.1 06/16/2021   HGB 11.8 (L) 06/16/2021   HCT 35.7 (L) 06/16/2021   MCV 86.7 06/16/2021   PLT 212 06/16/2021    Assessment / Plan: Pt consents to c-section and her husband consents as well. She asked that he sign her form. I have consented her and ask that they both sign it. She was consented with the interpreter for transfusion if needed, repair of any tissue damage, and the peds are at the bedside. R/B discussed.  Christeen Douglas 06/17/2021, 6:04 AM

## 2021-06-17 NOTE — Op Note (Addendum)
Cesarean Section Procedure Note  Date of procedure: 06/17/2021   Indication: Complete dilation for >24hours   Pre-operative Diagnosis: Intrauterine pregnancy at [redacted]w[redacted]d;  - arrest of descent - large for gestational age - GBS unknown  Post-operative Diagnosis: same, delivered.  Procedure: Primary Low Transverse Cesarean Section through Pfannenstiel incision  Surgeon: Benjaman Kindler, MD  Assistant(s):  Avelino Leeds, CNM   Anesthesia: Spinal anesthesia  Anesthesiologist: No responsible provider has been recorded for the case. Anesthesiologist: Harrie Foreman, MD; Tera Mater, MD CRNA: Garner Nash, CRNA; Rolla Plate, CRNA  Estimated Blood Loss:   375         Drains: foley         Total IV Fluids: 772ml  Urine Output: 587ml         Specimens: none         Complications:  None; patient tolerated the procedure well. Language and cultural barrier prolonged the second stage. Pt consented to surgery only after extensive time and care from staff and providers.         Disposition: PACU - hemodynamically stable.         Condition: stable  Findings:  A female infant in cephalic OP presentation. Amniotic fluid - Meconium  Birth weight 4910 g.  Apgars of 8 and 9 at one and five minutes respectively.  Intact placenta with a three-vessel cord.  Grossly normal uterus, tubes and ovaries bilaterally. No intraabdominal adhesions were noted.  Indications: failure to progress: arrest of descent Pt was fully dilated for many hours without anesthesia prior to consenting. She was seen with her mother and husband during this time. She stated that baby would come in God's time. Will monitor her for vesicovaginal fistula and other delayed delivery complications postpartum.   Procedure Details  The patient was taken to Operating Room, identified as the correct patient and the procedure verified as C-Section Delivery. A formal Time Out was held with all team members present and  in agreement.  After induction of anesthesia, the patient was draped and prepped in the usual sterile manner. A Pfannenstiel skin incision was made and carried down through the subcutaneous tissue to the fascia. Fascial incision was made and extended transversely with the Mayo scissors. The fascia was separated from the underlying rectus tissue superiorly and inferiorly. The peritoneum was identified and entered bluntly. Peritoneal incision was extended longitudinally. The utero-vesical peritoneal reflection was incised transversely and a bladder flap was created digitally.   A low transverse hysterotomy was made. The fetus was delivered atraumatically. The umbilical cord was clamped x2 and cut and the infant was handed to the awaiting pediatricians. The placenta was removed intact and appeared normal, intact, and with a 3-vessel cord.   The uterus was exteriorized and cleared of all clot and debris. The hysterotomy was closed with running sutures of 0-Vicryl. A second imbricating layer was placed with the same suture. Excellent hemostasis was observed. The peritoneal cavity was cleared of all clots and debris. The uterus was returned to the abdomen.   The pelvis was irrigated and again, excellent hemostasis was noted. The fascia was then reapproximated with running sutures of 0 Vicryl. The skin was reapproximated with ensorb. 30 ml0.5% bupivicaine and 45ml of NSS placed in the fascial and skin lines.  Instrument, sponge, and needle counts were correct prior to the abdominal closure and at the conclusion of the case.   The patient tolerated the procedure well and was transferred to the recovery room in stable condition.  Benjaman Kindler, MD 06/17/2021

## 2021-06-17 NOTE — Anesthesia Preprocedure Evaluation (Signed)
Anesthesia Evaluation  Patient identified by MRN, date of birth, ID band Patient awake    Reviewed: Allergy & Precautions, H&P , NPO status , Patient's Chart, lab work & pertinent test results  Airway Mallampati: II  TM Distance: >3 FB Neck ROM: full    Dental no notable dental hx.    Pulmonary neg pulmonary ROS,    breath sounds clear to auscultation       Cardiovascular Exercise Tolerance: Good (-) hypertensionnegative cardio ROS   Rhythm:regular Rate:Normal     Neuro/Psych    GI/Hepatic negative GI ROS,   Endo/Other    Renal/GU   negative genitourinary   Musculoskeletal   Abdominal   Peds  Hematology negative hematology ROS (+) Hgb 11.8 g/dL Platelets 169C   Anesthesia Other Findings Past Medical History: 02/23/2021: Anemia 02/23/2021: Heart murmur No date: IDA (iron deficiency anemia)  History reviewed. No pertinent surgical history.  BMI    Body Mass Index: 32.72 kg/m      Reproductive/Obstetrics (+) Pregnancy                             Anesthesia Physical Anesthesia Plan  ASA: 2  Anesthesia Plan: Spinal   Post-op Pain Management:    Induction:   PONV Risk Score and Plan:   Airway Management Planned:   Additional Equipment:   Intra-op Plan:   Post-operative Plan:   Informed Consent: I have reviewed the patients History and Physical, chart, labs and discussed the procedure including the risks, benefits and alternatives for the proposed anesthesia with the patient or authorized representative who has indicated his/her understanding and acceptance.     Dental Advisory Given  Plan Discussed with: Anesthesiologist  Anesthesia Plan Comments: (Swahili interpreter used for consent. Consented to spinal with backup GA.   Questions invited and answered.)        Anesthesia Quick Evaluation

## 2021-06-17 NOTE — Progress Notes (Signed)
   06/17/21 0245  Clinical Encounter Type  Visited With Family  Visit Type Initial;Spiritual support;Social support  Spiritual Encounters  Spiritual Needs Other (Comment) (general assistance)   Chaplain Burris encountered pt's spouse and their priest as they were attempting to help him to leave for the night. Chaplain B guided them back to the ED so they could leave. Chaplain B offered her blessings to the father-to-be.

## 2021-06-17 NOTE — Discharge Summary (Shared)
Obstetrical Discharge Summary  Patient Name: Rachel Simon. Gudger DOB: February 03, 1995 MRN: [id number]  Date of Admission: 06/16/2021 Date of Delivery: 06/17/21 Delivered by: Dr Andris Flurry. Corlis Leak Date of Discharge: 06/19/2021  Primary OB: [facility] [id number] last menstrual period was 09/05/2020 (exact date). EDC Estimated Date of Delivery: 06/12/21 Gestational Age at Delivery: [redacted]w[redacted]d  Antepartum complications:  Late to prenatal care Anemia Diastolic heart murmur  Admitting Diagnosis: Normal labor [O80, Z37.9]  Secondary Diagnosis: Patient Active Problem List   Diagnosis Date Noted   Normal labor 06/16/2021   IDA (iron deficiency anemia) 03/02/2021   Health education/counseling 02/28/2021   Anemia affecting pregnancy 02/27/2021   Supervision of normal first pregnancy, antepartum 02/23/2021   Anemia Hgb 5.7 on 02/23/21 044/03/4740  diastolic heart murmer 059/56/3875  Late prenatal care  24 3/7 02/23/2021    Augmentation: AROM and Pitocin Complications: None Intrapartum complications/course: arrest of descent , patient completely dilated > 24 hours without progression. Delivery Type: cesarean indication: cephalo-pelvic disproportion and failure to progress: arrest of descent Anesthesia: Spinal Placenta: manual Incision: honeycomb dressing in place Episiotomy: none Newborn Data: Live born female  Birth Weight:  10.13 lbs APGAR: 8, 9  Newborn Delivery   Birth date/time: 06/17/2021 07:05:00 Delivery type: C-Section, Low Transverse Trial of labor: Yes C-section categorization: Primary     Postpartum Procedures: none Edinburgh:     06/17/2021    3:40 PM  Edinburgh Postnatal Depression Scale Screening Tool  I have been able to laugh and see the funny side of things. 0  I have looked forward with enjoyment to things. 0  I have blamed myself unnecessarily when things went wrong. 1  I have been anxious or worried for no good reason. 1  I have felt scared or panicky for no  good reason. 0  Things have been getting on top of me. 1  I have been so unhappy that I have had difficulty sleeping. 1  I have felt sad or miserable. 1  I have been so unhappy that I have been crying. 2  The thought of harming myself has occurred to me. 0  Edinburgh Postnatal Depression Scale Total 7   Cesarean Section:  Patient had an uncomplicated postpartum course.  By time of discharge on POD#2, her pain was controlled on oral pain medications; she had appropriate lochia and was ambulating, voiding without difficulty, tolerating regular diet and passing flatus.   She was deemed stable for discharge to home.    Discharge Physical Exam:   BP 111/64 (BP Location: Right Arm)   Pulse 70   Temp 98.7 F (37.1 C) (Oral)   Resp 18   Ht 5' (1.524 m)   Wt 76 kg   LMP 09/05/2020 (Exact Date)   SpO2 99% Comment: Room Air  Breastfeeding Yes   BMI 32.72 kg/m   General: NAD CV: RRR Pulm: CTABL, nl effort ABD: s/nd/nt, fundus firm and below the umbilicus Lochia: moderate Incision: clean, dry, intact, pressure dressing changed to a new clean honeycomb dressing with gauze. No drainage at this time, scant old drainage noted on pressure dressing. DVT Evaluation: LE non-ttp, no evidence of DVT on exam.  Hemoglobin  Date Value Ref Range Status  06/19/2021 9.1 (L) 12.0 - 15.0 g/dL Final  02/23/2021 5.7 (LL) 11.1 - 15.9 g/dL Final    Comment:    **CBC Results Repeated**   HCT  Date Value Ref Range Status  06/19/2021 28.0 (L) 36.0 - 46.0 % Final   Hematocrit  Date Value Ref Range Status  02/23/2021 19.1 (L) 34.0 - 46.6 % Final     Disposition: stable, discharge to home. Baby Feeding: breastmilk Baby Disposition: home with mom  Rh Immune globulin given: N/A Rubella vaccine given: MMR x2          (06/03/14, 11/18/14) Varivax vaccine given: Varivax x2        (11/18/14, 01/27/15) Flu vaccine given in AP or PP setting: 02/23/21 Tdap vaccine given in AP or PP setting:  04/17/21  Contraception: none  Prenatal Labs:  Blood type/Rh B pos  Antibody screen neg  Rubella MMR x2          (06/03/14, 11/18/14)  Varicella Varivax x2        (11/18/14, 01/27/15)  RPR NR  HBsAg Neg  HIV NR  GC neg  Chlamydia neg  Genetic screening    1 hour GTT 97  3 hour GTT    GBS      Plan:  Brynna A. Railsback was discharged to home in good condition. Follow-up appointment with delivering provider in 6 weeks.  Discharge Medications: Allergies as of 06/19/2021   No Known Allergies      Medication List     TAKE these medications    acetaminophen 500 MG tablet Commonly known as: TYLENOL Take 2 tablets (1,000 mg total) by mouth every 6 (six) hours.   ferrous sulfate 325 (65 FE) MG tablet Take 1 tablet (325 mg total) by mouth 2 (two) times daily with a meal.   ibuprofen 600 MG tablet Commonly known as: ADVIL Take 1 tablet (600 mg total) by mouth every 6 (six) hours.   oxyCODONE 5 MG immediate release tablet Commonly known as: Oxy IR/ROXICODONE Take 1-2 tablets (5-10 mg total) by mouth every 4 (four) hours as needed for up to 7 days for moderate pain.   Prenatal Vitamin 27-0.8 MG Tabs Take 1 tablet by mouth daily at 6 (six) AM.         Follow-up Information     Benjaman Kindler, MD Follow up in 2 week(s).   Specialty: Obstetrics and Gynecology Why: 2wk incision check Contact information: [place] [address] [phone]         Benjaman Kindler, MD Follow up in 6 week(s).   Specialty: Obstetrics and Gynecology Why: 6wk postpartum Contact information: [place] [address] [phone]                 Signed: Gertie Fey, [place] 06/19/2021 5:12 PM

## 2021-06-17 NOTE — Progress Notes (Signed)
RN at bedside for labor support. Pt's husband has left to drive their pastor home. Pt states "the pain is too much" and cries out "Jesus, help me" during contractions. RN used interpreter line to discuss pain management options, including IV pain medication and epidural. Pt states the pain is coming and going, and "it is not God's will" for her to receive pain medication or the epidural at this time. RN offered clear fluids, pt declines at this time. Her aunt is resting at the bedside. RN assisted pt into standing position at pt's request. She is swaying and leaning on the bed through contractions.

## 2021-06-17 NOTE — Progress Notes (Signed)
Post Partum Day 0 Subjective: Doing well, no complaints.  foley cath in place. Denies pain.   No CP SOB Fever,Chills, N/V or leg pain; denies nipple or breast pain, no HA change of vision, RUQ/epigastric pain  Objective: BP 117/70   Pulse 73   Temp 98.1 F (36.7 C) (Oral)   Resp 17   Ht 5' (1.524 m)   Wt 76 kg   LMP 09/05/2020 (Exact Date)   SpO2 95%   Breastfeeding Yes   BMI 32.72 kg/m    Physical Exam:  General: NAD CV: RRR Pulm: nl effort, CTABL Abdomen: soft, NT, BS x 4 Incision: pressure Dsg CDI/Steristrips intact/no erythema or drainage Lochia: moderate with 2 small clots <3cm size. Uterine Fundus: fundus firm with massage and 2 fb below umbilicus DVT Evaluation: no cords, ttp LEs   Recent Labs    06/16/21 0528  HGB 11.8*  HCT 35.7*  WBC 6.1  PLT 212    Assessment/Plan: 26 y.o. G1P1001 postpartum day # 0  - Continue routine PP care - given methergine, fundus firmed with massage; pads changed.  - will cont to monitor    Disposition: plan transfer to [facility]   Randa Ngo, CNM 06/17/2021  10:13 AM

## 2021-06-17 NOTE — Op Note (Incomplete Revision)
  Cesarean Section Procedure Note  Date of procedure: 06/17/2021   Indication: Complete dilation for >24hours   Pre-operative Diagnosis: Intrauterine pregnancy at [redacted]w[redacted]d;  - arrest of descent - large for gestational age - GBS unknown -   Post-operative Diagnosis: same, delivered.  Procedure: Primary Low Transverse Cesarean Section through Pfannenstiel incision  Surgeon: Christeen Douglas, MD  Assistant(s):  Chari Manning, CNM   Anesthesia: Spinal anesthesia  Anesthesiologist: No responsible provider has been recorded for the case. Anesthesiologist: Briant Cedar, MD; Karleen Hampshire, MD CRNA: Derenda Mis, CRNA; Mathews Argyle, CRNA  Estimated Blood Loss:   375         Drains: foley         Total IV Fluids:  Urine Output:         Specimens: none         Complications:  None; patient tolerated the procedure well.         Disposition: PACU - hemodynamically stable.         Condition: stable  Findings:  A female infant in cephalic OP presentation. Amniotic fluid - Meconium  Birth weight 4910 g.  Apgars of 8 and 9 at one and five minutes respectively.  Intact placenta with a three-vessel cord.  Grossly normal uterus, tubes and ovaries bilaterally. No intraabdominal adhesions were noted.  Indications: failure to progress: arrest of descent  Procedure Details  The patient was taken to Operating Room, identified as the correct patient and the procedure verified as C-Section Delivery. A formal Time Out was held with all team members present and in agreement.  After induction of anesthesia, the patient was draped and prepped in the usual sterile manner. A Pfannenstiel skin incision was made and carried down through the subcutaneous tissue to the fascia. Fascial incision was made and extended transversely with the Mayo scissors. The fascia was separated from the underlying rectus tissue superiorly and inferiorly. The peritoneum was identified and entered  bluntly. Peritoneal incision was extended longitudinally. The utero-vesical peritoneal reflection was incised transversely and a bladder flap was created digitally.   A low transverse hysterotomy was made. The fetus was delivered atraumatically. The umbilical cord was clamped x2 and cut and the infant was handed to the awaiting pediatricians. The placenta was removed intact and appeared normal, intact, and with a 3-vessel cord.   The uterus was exteriorized and cleared of all clot and debris. The hysterotomy was closed with running sutures of 0-Vicryl. A second imbricating layer was placed with the same suture. Excellent hemostasis was observed. The peritoneal cavity was cleared of all clots and debris. The uterus was returned to the abdomen.   The pelvis was irrigated and again, excellent hemostasis was noted. The fascia was then reapproximated with running sutures of 0 Vicryl. The skin was reapproximated with ensorb. 30 ml0.5% bupivicaine and 16ml of NSS placed in the fascial and skin lines.  Instrument, sponge, and needle counts were correct prior to the abdominal closure and at the conclusion of the case.   The patient tolerated the procedure well and was transferred to the recovery room in stable condition.   Christeen Douglas, MD 06/17/2021

## 2021-06-17 NOTE — Transfer of Care (Signed)
Immediate Anesthesia Transfer of Care Note  Patient: Rachel Simon  Procedure(s) Performed: CESAREAN SECTION  Patient Location: PACU and Mother/Baby  Anesthesia Type:Spinal  Level of Consciousness: alert   Airway & Oxygen Therapy: Patient Spontanous Breathing  Post-op Assessment: Report given to RN  Post vital signs: stable  Last Vitals:  Vitals Value Taken Time  BP    Temp 97.8   Pulse    Resp    SpO2      Last Pain:  Vitals:   06/17/21 0500  TempSrc: Oral  PainSc:       Patients Stated Pain Goal: 0 (06/17/21 0352)  Complications: No notable events documented.

## 2021-06-17 NOTE — Progress Notes (Signed)
Rachel Simon is a 26 y.o. G1P0 at [redacted]w[redacted]d  Subjective: Doing well subjectively with contractions, praying with her aunt and changing positions. Her spouse has left to pick up a spiritual leader  Objective: BP 129/73 (BP Location: Right Arm)   Pulse 75   Temp 98.7 F (37.1 C) (Oral)   Resp 16   Ht 5' (1.524 m)   Wt 76 kg   LMP 09/05/2020 (Exact Date)   BMI 32.72 kg/m  No intake/output data recorded. No intake/output data recorded.  FHT:  FHR: 135 bpm, variability: moderate,  accelerations:  Present,  decelerations:  Present occasional late decels, resolved with position change , continued accels intermittently, occasional late decels UC:   irregular, every 2-4 minutes with coupling SVE:   Dilation: 7 Effacement (%): 70 Station: -2 Exam by:: Dalbert Garnet MD  Labs: Lab Results  Component Value Date   WBC 6.1 06/16/2021   HGB 11.8 (L) 06/16/2021   HCT 35.7 (L) 06/16/2021   MCV 86.7 06/16/2021   PLT 212 06/16/2021    Assessment / Plan: Pt continues to decline surgical intervention. She declines epidural at this time. I have again reiterated that I am concerned for the baby with late decels and destationing with a swollen cervix. She has had 25mg  iv benadryl for edema of the cervix. Minimal caput through the cervix, I can't feel the position any longer. Will continue to monitor until patient and family consent to c-section and will continue to try to avoid an emergent fetal decompensation. We will continue, however, to respect the patient and her family's wishes and request consent without coercion.  06/17/2021, 12:20 AM

## 2021-06-17 NOTE — Anesthesia Procedure Notes (Signed)
Spinal  Patient location during procedure: OR Start time: 06/17/2021 6:37 AM End time: 06/17/2021 6:42 AM Reason for block: surgical anesthesia Staffing Performed: anesthesiologist  Anesthesiologist: Tera Mater, MD Preanesthetic Checklist Completed: patient identified, IV checked, site marked, risks and benefits discussed, surgical consent, monitors and equipment checked, pre-op evaluation and timeout performed Spinal Block Patient position: sitting Prep: ChloraPrep Patient monitoring: heart rate, continuous pulse ox, blood pressure and cardiac monitor Approach: midline Location: L3-4 Injection technique: single-shot Needle Needle type: Whitacre and Introducer  Needle gauge: 24 G Needle length: 9 cm Assessment Events: CSF return Additional Notes Negative paresthesia. Negative blood return. Positive free-flowing CSF. Expiration date of kit checked and confirmed. Patient tolerated procedure well, without complications.

## 2021-06-18 LAB — CBC
HCT: 29.7 % — ABNORMAL LOW (ref 36.0–46.0)
Hemoglobin: 9.7 g/dL — ABNORMAL LOW (ref 12.0–15.0)
MCH: 28.6 pg (ref 26.0–34.0)
MCHC: 32.7 g/dL (ref 30.0–36.0)
MCV: 87.6 fL (ref 80.0–100.0)
Platelets: 164 10*3/uL (ref 150–400)
RBC: 3.39 MIL/uL — ABNORMAL LOW (ref 3.87–5.11)
RDW: 13.1 % (ref 11.5–15.5)
WBC: 11.4 10*3/uL — ABNORMAL HIGH (ref 4.0–10.5)
nRBC: 0 % (ref 0.0–0.2)

## 2021-06-18 MED ORDER — ACETAMINOPHEN 500 MG PO TABS
1000.0000 mg | ORAL_TABLET | Freq: Four times a day (QID) | ORAL | Status: DC
  Administered 2021-06-18 – 2021-06-19 (×5): 1000 mg via ORAL
  Filled 2021-06-18 (×6): qty 2

## 2021-06-18 NOTE — Anesthesia Postprocedure Evaluation (Signed)
Anesthesia Post Note  Patient: Rachel Simon  Procedure(s) Performed: CESAREAN SECTION  Patient location during evaluation: Mother Baby Anesthesia Type: Spinal Level of consciousness: oriented and awake and alert Pain management: pain level controlled Vital Signs Assessment: post-procedure vital signs reviewed and stable Respiratory status: spontaneous breathing and respiratory function stable Cardiovascular status: blood pressure returned to baseline and stable Postop Assessment: no headache, no backache, no apparent nausea or vomiting, able to ambulate and adequate PO intake Anesthetic complications: no   No notable events documented.   Last Vitals:  Vitals:   06/18/21 0200 06/18/21 0814  BP:  107/65  Pulse: 67 (!) 58  Resp:  20  Temp:  36.7 C  SpO2: 99% 98%    Last Pain:  Vitals:   06/18/21 0830  TempSrc:   PainSc: 4                  Lenard Simmer

## 2021-06-18 NOTE — Anesthesia Post-op Follow-up Note (Signed)
  Anesthesia Pain Follow-up Note  Patient: Rachel Simon  Day #: 1  Date of Follow-up: 06/18/2021 Time: 2:56 PM  Last Vitals:  Vitals:   06/18/21 0200 06/18/21 0814  BP:  107/65  Pulse: 67 (!) 58  Resp:  20  Temp:  36.7 C  SpO2: 99% 98%    Level of Consciousness: alert  Pain: mild   Side Effects:None  Catheter Site Exam:clean, dry     Plan: D/C from anesthesia care at surgeon's request  Lenard Simmer

## 2021-06-18 NOTE — Progress Notes (Signed)
Post Partum Day 1  Subjective: Doing well, no complaints.  Tolerating regular diet, pain with PO meds, voiding and ambulating without difficulty.  No CP SOB Fever,Chills, N/V or leg pain; denies nipple or breast pain, no HA change of vision, RUQ/epigastric pain  Objective: BP 107/65 (BP Location: Right Arm)   Pulse (!) 58   Temp 98 F (36.7 C)   Resp 20   Ht 5' (1.524 m)   Wt 76 kg   LMP 09/05/2020 (Exact Date)   SpO2 98%   Breastfeeding Yes   BMI 32.72 kg/m    Physical Exam:  General: NAD CV: RRR Pulm: nl effort, CTABL Abdomen: soft, NT, BS x 4 Incision: honeycomb Dsg CDI, no erythema or drainage Lochia: small Uterine Fundus: fundus firm and 1 fb below umbilicus DVT Evaluation: no cords, ttp LEs   Recent Labs    06/17/21 1119 06/18/21 0529  HGB 11.7* 9.7*  HCT 35.1* 29.7*  WBC 12.1* 11.4*  PLT 184 164    Assessment/Plan: 26 y.o. G1P1001 postpartum day # 1  - Continue routine PP care - encouraged snug fitting bra and cabbage leaves for bottlefeeding.  - Acute blood loss anemia - hemodynamically stable and asymptomatic;  po ferrous sulfate BID with stool softeners  - Immunization status:  all Imms up to date    Disposition: Does not desire Dc home today.     Randa Ngo, CNM 06/18/2021  10:33 AM

## 2021-06-19 LAB — CBC
HCT: 28 % — ABNORMAL LOW (ref 36.0–46.0)
Hemoglobin: 9.1 g/dL — ABNORMAL LOW (ref 12.0–15.0)
MCH: 28.6 pg (ref 26.0–34.0)
MCHC: 32.5 g/dL (ref 30.0–36.0)
MCV: 88.1 fL (ref 80.0–100.0)
Platelets: 170 10*3/uL (ref 150–400)
RBC: 3.18 MIL/uL — ABNORMAL LOW (ref 3.87–5.11)
RDW: 13.4 % (ref 11.5–15.5)
WBC: 7.9 10*3/uL (ref 4.0–10.5)
nRBC: 0 % (ref 0.0–0.2)

## 2021-06-19 LAB — PROTIME-INR
INR: 1 (ref 0.8–1.2)
Prothrombin Time: 13 seconds (ref 11.4–15.2)

## 2021-06-19 MED ORDER — ACETAMINOPHEN 500 MG PO TABS: 1000.0000 mg | ORAL_TABLET | Freq: Four times a day (QID) | ORAL | 0 refills | Status: DC

## 2021-06-19 MED ORDER — IBUPROFEN 600 MG PO TABS: 600.0000 mg | ORAL_TABLET | Freq: Four times a day (QID) | ORAL | 0 refills | Status: DC

## 2021-06-19 MED ORDER — OXYCODONE HCL 5 MG PO TABS: 5.0000 mg | ORAL_TABLET | ORAL | 0 refills | Status: AC | PRN

## 2021-06-19 MED ORDER — FERROUS SULFATE 325 (65 FE) MG PO TABS: 325.0000 mg | ORAL_TABLET | Freq: Two times a day (BID) | ORAL | 3 refills | Status: DC

## 2021-06-19 NOTE — Progress Notes (Signed)
Patient discharged home with family.  Discharge instructions, when to follow up, and prescriptions reviewed with patient.  Patient verbalized understanding with Swahili interpretor. Patient will be escorted out by auxiliary.

## 2021-06-19 NOTE — Progress Notes (Signed)
S: Called by postpartum RN to room to assess patient's incision for new bloody discharge on dressing. Pt was about to be discharged but new bleeding noted by RN. Pt denies any pain, dizziness, or concerns.  O:  Larger area of bright red discharge noted along lower edge of honeycomb dressing. Dressing removed and bloody discharge noted to be slowly oozing from 0.5cm area on left-side of incision. Incision approximated.  Last received 40mg  Lovenox on 06/18/2021 @ 0827.   A: Post-operative day 2, incisional bleeding  P: Pressure dressing placed over incision STAT CBC, PT/INR ordered Reassess pressure dressing every hour for 3-4 hours. If bleeding stops and labs appropriate, consider discharge home at that time. Reviewed assessment and POC with Dr. 06/20/2021 and he is agreeable with POC.  Feliberto Gottron, CNM 06/19/2021 1:22 PM

## 2021-06-19 NOTE — Lactation Note (Signed)
This note was copied from a baby's chart. Lactation Consultation Note  Patient Name: Rachel Simon [id number] Date: 06/19/2021 Reason for consult: Initial assessment;Term;Primapara;Breastfeeding assistance Age:24 hours  Maternal Data  This is mom's first baby, C/S for failure to progress.Per chart review mom with history of iron deficiency anemia, diastolic hear murmur, and late to [facility]. Mom's feeding plan is to breastfeed and formula feed her baby. Has patient been taught Hand Expression?: Yes Does the patient have breastfeeding experience prior to this delivery?: No  Feeding Mother's Current Feeding Choice: Breast Milk and Formula Observed mom independently position and latch baby well in cradle position. Baby with audible swallows. Provided mom with tips and strategies to encourage baby to actively feed. Reviewed breastfeeding basics including to offer baby to breastfeed first, feed at least 8-12 times /24 hours, how the body knows to make milk, how to know the baby is getting enough, and how to hand express or use the manual pump to extract milk that she could offer her milk via bottle as another option to her feeding plan. Also, encouraged mom to drink to thirst. Mom's lips very dry.   LATCH Score Latch: Grasps breast easily, tongue down, lips flanged, rhythmical sucking.  Audible Swallowing: Spontaneous and intermittent  Type of Nipple: Everted at rest and after stimulation  Comfort (Breast/Nipple): Soft / non-tender  Hold (Positioning): No assistance needed to correctly position infant at breast.  LATCH Score: 10   Lactation Tools Discussed/Used  Manual breast pump.  Interventions Interventions: Breast feeding basics reviewed;Hand express;Support pillows;Education  Discharge Discharge Education: Engorgement and breast care;Warning signs for feeding baby;Other (comment) (Provided contact information for lactation assistance at [place] if mother needs additional help once  she goes home.) Pump: Manual (Mother provided with manual harmony breastpump as she did not have a pump for home use. Instructions regarding use and storage of breastmilk reviewed. Mom verbalized understanding and practiced using the manual pump.)  Consult Status Consult Status: PRN  All discussed with mother with assistance of Sw[place] interpreter via [employer] interpreter services.  Update provided to care nurse.  Fuller Song 06/19/2021, 11:14 AM

## 2021-06-19 NOTE — Discharge Instructions (Signed)

## 2021-06-20 ENCOUNTER — Encounter: Payer: Self-pay | Admitting: Obstetrics and Gynecology

## 2021-06-20 ENCOUNTER — Other Ambulatory Visit: Payer: Self-pay

## 2021-06-20 NOTE — Telephone Encounter (Signed)
Telephone call to patient regarding her missed [facility].  Left message for her to call [phone] to reschedule her appointment.  Language Line used today.  Sw[place]ili interpreter used [place] / [phone].  Hart Carwin, RN

## 2021-06-21 NOTE — Telephone Encounter (Signed)
Patient delivered 06/17/2021.Marland KitchenJenetta Downer, RN

## 2021-08-02 ENCOUNTER — Telehealth: Payer: Self-pay

## 2021-08-11 ENCOUNTER — Ambulatory Visit: Payer: Medicaid Other

## 2021-08-14 ENCOUNTER — Emergency Department: Payer: Medicaid Other

## 2021-08-14 ENCOUNTER — Other Ambulatory Visit: Payer: Self-pay

## 2021-08-14 ENCOUNTER — Encounter: Payer: Self-pay | Admitting: *Deleted

## 2021-08-14 ENCOUNTER — Emergency Department
Admission: EM | Admit: 2021-08-14 | Discharge: 2021-08-15 | Disposition: A | Discharge status: Home / Self Care | Payer: Medicaid Other | Attending: Emergency Medicine | Admitting: Emergency Medicine

## 2021-08-14 DIAGNOSIS — R1084 Generalized abdominal pain: Secondary | ICD-10-CM

## 2021-08-14 DIAGNOSIS — K5900 Constipation, unspecified: Secondary | ICD-10-CM | POA: Diagnosis not present

## 2021-08-14 DIAGNOSIS — R1031 Right lower quadrant pain: Secondary | ICD-10-CM | POA: Insufficient documentation

## 2021-08-14 LAB — URINALYSIS, ROUTINE W REFLEX MICROSCOPIC
Bilirubin Urine: NEGATIVE
Glucose, UA: NEGATIVE mg/dL
Hgb urine dipstick: NEGATIVE
Ketones, ur: NEGATIVE mg/dL
Leukocytes,Ua: NEGATIVE
Nitrite: NEGATIVE
Protein, ur: NEGATIVE mg/dL
Specific Gravity, Urine: 1.016 (ref 1.005–1.030)
pH: 5 (ref 5.0–8.0)

## 2021-08-14 LAB — CBC WITH DIFFERENTIAL/PLATELET
Abs Immature Granulocytes: 0.01 10*3/uL (ref 0.00–0.07)
Basophils Absolute: 0 10*3/uL (ref 0.0–0.1)
Basophils Relative: 1 %
Eosinophils Absolute: 0.1 10*3/uL (ref 0.0–0.5)
Eosinophils Relative: 1 %
HCT: 34.4 % — ABNORMAL LOW (ref 36.0–46.0)
Hemoglobin: 11.1 g/dL — ABNORMAL LOW (ref 12.0–15.0)
Immature Granulocytes: 0 %
Lymphocytes Relative: 53 %
Lymphs Abs: 2 10*3/uL (ref 0.7–4.0)
MCH: 28.1 pg (ref 26.0–34.0)
MCHC: 32.3 g/dL (ref 30.0–36.0)
MCV: 87.1 fL (ref 80.0–100.0)
Monocytes Absolute: 0.3 10*3/uL (ref 0.1–1.0)
Monocytes Relative: 7 %
Neutro Abs: 1.4 10*3/uL — ABNORMAL LOW (ref 1.7–7.7)
Neutrophils Relative %: 38 %
Platelets: 243 10*3/uL (ref 150–400)
RBC: 3.95 MIL/uL (ref 3.87–5.11)
RDW: 14.1 % (ref 11.5–15.5)
WBC: 3.8 10*3/uL — ABNORMAL LOW (ref 4.0–10.5)
nRBC: 0 % (ref 0.0–0.2)

## 2021-08-14 LAB — COMPREHENSIVE METABOLIC PANEL
ALT: 27 U/L (ref 0–44)
AST: 31 U/L (ref 15–41)
Albumin: 4.3 g/dL (ref 3.5–5.0)
Alkaline Phosphatase: 129 U/L — ABNORMAL HIGH (ref 38–126)
Anion gap: 8 (ref 5–15)
BUN: 13 mg/dL (ref 6–20)
CO2: 22 mmol/L (ref 22–32)
Calcium: 9.3 mg/dL (ref 8.9–10.3)
Chloride: 110 mmol/L (ref 98–111)
Creatinine, Ser: 0.69 mg/dL (ref 0.44–1.00)
GFR, Estimated: >60 mL/min (ref >60)
Glucose, Bld: 87 mg/dL (ref 70–99)
Potassium: 3.7 mmol/L (ref 3.5–5.1)
Sodium: 140 mmol/L (ref 135–145)
Total Bilirubin: 0.5 mg/dL (ref 0.3–1.2)
Total Protein: 7.5 g/dL (ref 6.5–8.1)

## 2021-08-14 LAB — POC URINE PREG, ED: Preg Test, Ur: NEGATIVE

## 2021-08-14 LAB — LIPASE, BLOOD: Lipase: 31 U/L (ref 11–51)

## 2021-08-14 MED ORDER — DOCUSATE SODIUM 100 MG PO CAPS: 100.0000 mg | ORAL_CAPSULE | Freq: Every day | ORAL | 2 refills | Status: AC

## 2021-08-14 MED ORDER — POLYETHYLENE GLYCOL 3350 17 GM/SCOOP PO POWD: 17.0000 g | Freq: Every day | ORAL | 0 refills | Status: DC

## 2021-08-14 NOTE — ED Provider Triage Note (Signed)
Emergency Medicine Provider Triage Evaluation Note  Rachel Simon , a 26 y.o. female  was evaluated in triage.  Pt complains of R side abdominal for a week. No nausea/emesis. No reported fever.  Review of Systems  Positive: R lower abd pain Negative: Fever, emesis, urinary complaints, vaginal discharge  Physical Exam  There were no vitals taken for this visit. Gen:   Awake, no distress   Resp:  Normal effort  MSK:   Moves extremities without difficulty  Other:    Medical Decision Making  Medically screening exam initiated at 8:17 PM.  Appropriate orders placed.  Rachel Simon was informed that the remainder of the evaluation will be completed by another provider, this initial triage assessment does not replace that evaluation, and the importance of remaining in the ED until their evaluation is complete.  R lower abd pain. Labs, urinalysis   Racheal Patches, PA-C 08/14/21 2020

## 2021-08-14 NOTE — ED Triage Notes (Signed)
Pt has right side abd pain for 1 week.  No vag bleeding.  No back pain.  Denies urinary sx.  No n/v/d.  Pt alert speech clear.

## 2021-08-14 NOTE — Discharge Instructions (Signed)
Please use 1 capful of MiraLAX with a large glass of water twice daily until you have multiple bowel movements then you may stop use of MiraLAX.  Please continue to take Colace 1 capsule each morning to help keep the stool soft and prevent constipation.  Return to the emergency department for any worsening abdominal pain, any fever, or any other symptom personally concerning to yourself.

## 2021-08-14 NOTE — ED Provider Notes (Signed)
[facility] Provider Note    Event Date/Time   First MD Initiated Contact with Patient 08/14/21 2237     (approximate)  Patient speaks Swahili, patient wishes for her husband to interpret. History   Chief Complaint: Abdominal Pain  HPI  Rachel Simon is a 26 y.o. female with a past medical history of anemia, presents to the emergency department for right lower quadrant abdominal pain.  According to the patient for the past week or so she has been experiencing intermittent pain in the right lower quadrant that has become more persistent.  Denies any nausea vomiting diarrhea dysuria or vaginal symptoms.  Physical Exam   Triage Vital Signs: ED Triage Vitals  Enc Vitals Group     BP 08/14/21 2019 113/70     Pulse Rate 08/14/21 2019 (!) 57     Resp 08/14/21 2019 20     Temp 08/14/21 2019 97.9 F (36.6 C)     Temp Source 08/14/21 2019 Oral     SpO2 08/14/21 2019 98 %     Weight 08/14/21 2019 167 lb 8.8 oz (76 kg)     Height 08/14/21 2019 5' (1.524 m)     Head Circumference --      Peak Flow --      Pain Score 08/14/21 2017 6     Pain Loc --      Pain Edu? --      Excl. in GC? --     Most recent vital signs: Vitals:   08/14/21 2019  BP: 113/70  Pulse: (!) 57  Resp: 20  Temp: 97.9 F (36.6 C)  SpO2: 98%    General: Awake, no distress.  CV:  Good peripheral perfusion.  Regular rate and rhythm  Resp:  Normal effort.  Equal breath sounds bilaterally.  Abd:  No distention.  Soft, mild right lower quadrant tenderness to palpation without rebound or guarding.  Remainder the abdomen is benign  ED Results / Procedures / Treatments   RADIOLOGY  I personally reviewed and interpreted the CT images.  Patient appears to have significant constipation but no other significant acute abnormality seen on my evaluation.   MEDICATIONS ORDERED IN ED: Medications - No data to display   IMPRESSION / MDM / ASSESSMENT AND PLAN / ED COURSE  I reviewed  the triage vital signs and the nursing notes.  Patient's presentation is most consistent with acute presentation with potential threat to life or bodily function.  Patient presents emergency department for right lower quadrant abdominal pain intermittent over the past 1 week.  No nausea vomiting diarrhea fever dysuria vaginal bleeding or discharge.  Patient does have mild to moderate right lower quadrant tenderness to palpation.  Lab work is reassuring with an overall normal white blood cell count, reassuring chemistry and negative urinalysis negative lipase and a negative pregnancy test.  Given the patient's right lower quadrant tenderness we will obtain CT imaging to further evaluate.  Differential would still include ureterolithiasis, ovarian cysts, appendicitis, constipation.  CT appears most consistent with constipation do not see any other acute finding on my evaluation.  We will place the patient on MiraLAX twice daily until she has multiple bowel movements.  Patient agreeable to plan of care.  Discussed return precautions.  FINAL CLINICAL IMPRESSION(S) / ED DIAGNOSES   Right lower quadrant abdominal pain  Rx / DC Orders   MiraLAX  Note:  This document was prepared using Dragon voice recognition software and may include unintentional  dictation errors.   Minna Antis, MD 08/14/21 2330

## 2021-08-15 NOTE — ED Notes (Signed)
Patient discharged at this time. Ambulated to lobby with independent and steady gait. Breathing unlabored speaking in full sentences. Verbalized understanding of all discharge, follow up, and medication teaching. Discharged homed with all belongings.   

## 2021-08-16 ENCOUNTER — Telehealth: Payer: Self-pay | Admitting: Family Medicine

## 2021-08-16 NOTE — Telephone Encounter (Signed)
Return call back to patient.  Left message to return the call at [phone].  Language Line used today.  Swahili interpreter used today.  Corrie Dandy / [phone].  Hart Carwin, RN

## 2022-05-10 ENCOUNTER — Encounter (HOSPITAL_COMMUNITY): Payer: Self-pay

## 2022-05-10 ENCOUNTER — Emergency Department (HOSPITAL_COMMUNITY)
Admission: EM | Admit: 2022-05-10 | Discharge: 2022-05-10 | Disposition: A | Discharge status: Home / Self Care | Payer: Medicaid Other | Attending: Emergency Medicine | Admitting: Emergency Medicine

## 2022-05-10 ENCOUNTER — Other Ambulatory Visit: Payer: Self-pay

## 2022-05-10 DIAGNOSIS — R109 Unspecified abdominal pain: Secondary | ICD-10-CM

## 2022-05-10 DIAGNOSIS — R1084 Generalized abdominal pain: Secondary | ICD-10-CM | POA: Diagnosis not present

## 2022-05-10 LAB — CBC WITH DIFFERENTIAL/PLATELET
Abs Immature Granulocytes: 0.01 10*3/uL (ref 0.00–0.07)
Basophils Absolute: 0 10*3/uL (ref 0.0–0.1)
Basophils Relative: 0 %
Eosinophils Absolute: 0 10*3/uL (ref 0.0–0.5)
Eosinophils Relative: 1 %
HCT: 30 % — ABNORMAL LOW (ref 36.0–46.0)
Hemoglobin: 9.5 g/dL — ABNORMAL LOW (ref 12.0–15.0)
Immature Granulocytes: 0 %
Lymphocytes Relative: 47 %
Lymphs Abs: 2.4 10*3/uL (ref 0.7–4.0)
MCH: 26.6 pg (ref 26.0–34.0)
MCHC: 31.7 g/dL (ref 30.0–36.0)
MCV: 84 fL (ref 80.0–100.0)
Monocytes Absolute: 0.4 10*3/uL (ref 0.1–1.0)
Monocytes Relative: 7 %
Neutro Abs: 2.2 10*3/uL (ref 1.7–7.7)
Neutrophils Relative %: 45 %
Platelets: 313 10*3/uL (ref 150–400)
RBC: 3.57 MIL/uL — ABNORMAL LOW (ref 3.87–5.11)
RDW: 13.2 % (ref 11.5–15.5)
WBC: 5 10*3/uL (ref 4.0–10.5)
nRBC: 0 % (ref 0.0–0.2)

## 2022-05-10 LAB — COMPREHENSIVE METABOLIC PANEL
ALT: 12 U/L (ref 0–44)
AST: 17 U/L (ref 15–41)
Albumin: 3.9 g/dL (ref 3.5–5.0)
Alkaline Phosphatase: 65 U/L (ref 38–126)
Anion gap: 7 (ref 5–15)
BUN: 16 mg/dL (ref 6–20)
CO2: 21 mmol/L — ABNORMAL LOW (ref 22–32)
Calcium: 8.9 mg/dL (ref 8.9–10.3)
Chloride: 105 mmol/L (ref 98–111)
Creatinine, Ser: 0.94 mg/dL (ref 0.44–1.00)
GFR, Estimated: >60 mL/min (ref >60)
Glucose, Bld: 91 mg/dL (ref 70–99)
Potassium: 3.7 mmol/L (ref 3.5–5.1)
Sodium: 133 mmol/L — ABNORMAL LOW (ref 135–145)
Total Bilirubin: 0.6 mg/dL (ref 0.3–1.2)
Total Protein: 7.3 g/dL (ref 6.5–8.1)

## 2022-05-10 LAB — LIPASE, BLOOD: Lipase: 48 U/L (ref 11–51)

## 2022-05-10 MED ORDER — ONDANSETRON 4 MG PO TBDP
4.0000 mg | ORAL_TABLET | Freq: Once | ORAL | Status: AC
  Administered 2022-05-10: 4 mg via ORAL
  Filled 2022-05-10: qty 1

## 2022-05-10 MED ORDER — ONDANSETRON HCL 4 MG PO TABS: 4.0000 mg | ORAL_TABLET | Freq: Four times a day (QID) | ORAL | 0 refills | Status: DC | PRN

## 2022-05-10 MED ORDER — ACETAMINOPHEN 500 MG PO TABS
1000.0000 mg | ORAL_TABLET | Freq: Once | ORAL | Status: AC
  Administered 2022-05-10: 1000 mg via ORAL
  Filled 2022-05-10: qty 2

## 2022-05-10 NOTE — ED Provider Notes (Signed)
[place] EMERGENCY DEPARTMENT AT [facility] Provider Note   CSN: [id number] Arrival date & time: 05/10/22  1925     History Chief Complaint  Patient presents with   Abdominal Pain    HPI Rachel Simon is a 27 y.o. female presenting for 1 week of intermittent abdominal pain.  It is bilateral lower quadrants, intermittent in nature.  She denies fevers chills nausea vomiting syncope shortness of breath.  She is otherwise ambulatory tolerating p.o. intake.  States has a history of a C-section otherwise minimal medical history. No known sick contacts.  States that her menstrual cycle is active at this time.  Patient's recorded medical, surgical, social, medication list and allergies were reviewed in the Snapshot window as part of the initial history.   Review of Systems   Review of Systems  Constitutional:  Negative for chills and fever.  HENT:  Negative for ear pain and sore throat.   Eyes:  Negative for pain and visual disturbance.  Respiratory:  Negative for cough and shortness of breath.   Cardiovascular:  Negative for chest pain and palpitations.  Gastrointestinal:  Positive for abdominal pain and nausea. Negative for vomiting.  Genitourinary:  Negative for dysuria and hematuria.  Musculoskeletal:  Negative for arthralgias and back pain.  Skin:  Negative for color change and rash.  Neurological:  Negative for seizures and syncope.  All other systems reviewed and are negative.   Physical Exam Updated Vital Signs BP 111/64   Pulse 67   Temp 98.4 F (36.9 C) (Oral)   Resp 17   SpO2 99%  Physical Exam Vitals and nursing note reviewed.  Constitutional:      General: She is not in acute distress.    Appearance: She is well-developed.  HENT:     Head: Normocephalic and atraumatic.  Eyes:     Conjunctiva/sclera: Conjunctivae normal.  Cardiovascular:     Rate and Rhythm: Normal rate and regular rhythm.     Heart sounds: No murmur heard. Pulmonary:      Effort: Pulmonary effort is normal. No respiratory distress.     Breath sounds: Normal breath sounds.  Abdominal:     General: There is no distension.     Palpations: Abdomen is soft.     Tenderness: There is generalized abdominal tenderness. There is no right CVA tenderness, left CVA tenderness or guarding.  Musculoskeletal:        General: No swelling or tenderness. Normal range of motion.     Cervical back: Neck supple.  Skin:    General: Skin is warm and dry.  Neurological:     General: No focal deficit present.     Mental Status: She is alert and oriented to person, place, and time. Mental status is at baseline.     Cranial Nerves: No cranial nerve deficit.      ED Course/ Medical Decision Making/ A&P    Procedures Procedures   Medications Ordered in ED Medications  acetaminophen (TYLENOL) tablet 1,000 mg (has no administration in time range)  ondansetron (ZOFRAN-ODT) disintegrating tablet 4 mg (has no administration in time range)   Medical Decision Making:   Rachel Simon is a 27 y.o. female who presented to the ED today with abdominal pain, detailed above.    Additional history discussed with patient's family/caregivers.  Patient placed on continuous vitals and telemetry monitoring while in ED which was reviewed periodically.  Complete initial physical exam performed, notably the patient  was HDS in NAD.  Reviewed and confirmed nursing documentation for past medical history, family history, social history.    Initial Assessment:   With the patient's presentation of abdominal pain, most likely diagnosis is nonspecific etiology. Other diagnoses were considered including (but not limited to) gastroenteritis, colitis, small bowel obstruction, appendicitis, cholecystitis, pancreatitis, nephrolithiasis, UTI, pyleonephritis, ruptured ectopic pregnancy, PID, ovarian torsion. These are considered less likely due to history of present illness and physical exam findings.    This is most consistent with an acute life/limb threatening illness complicated by underlying chronic conditions.   Initial Plan:  CBC/CMP to evaluate for underlying infectious/metabolic etiology for patient's abdominal pain  Lipase to evaluate for pancreatitis  EKG to evaluate for cardiac source of pain  Given ongoing pain, I recommended CTAB/Pelvis with contrast to evaluate for structural/surgical etiology of patients' severe abdominal pain.  Pregnancy test ordered Urinalysis and repeat physical assessment to evaluate for UTI/Pyelonpehritis  Empiric management of symptoms with escalating pain control and antiemetics as needed.   Initial Study Results:   Laboratory  All laboratory results reviewed without evidence of clinically relevant pathology.    Final Reassessment and Plan:   Had a shared medical decision making conversation with patient. I have recommended urine studies, pregnancy test, CT abdomen pelvis with contrast for further diagnostic care and management.  However patient declined the studies.  States that she would have been here too long and would like to follow-up with her normal doctor in the outpatient setting.  Will trial antiemetics and Tylenol in ER for symptom control we will trial outpatient management with similar.  I recommended patient return if she wishes to complete her evaluation or follow-up closely with her primary care provider. Disposition:  I have considered need for hospitalization, however, considering all of the above, I believe this patient is stable for discharge at this time.  Patient/family educated about specific return precautions for given chief complaint and symptoms.  Patient/family educated about follow-up with PCP .     Patient/family expressed understanding of return precautions and need for follow-up. Patient spoken to regarding all imaging and laboratory results and appropriate follow up for these results. All education provided in verbal  form with additional information in written form. Time was allowed for answering of patient questions. Patient discharged.    Emergency Department Medication Summary:   Medications  acetaminophen (TYLENOL) tablet 1,000 mg (has no administration in time range)  ondansetron (ZOFRAN-ODT) disintegrating tablet 4 mg (has no administration in time range)            Clinical Impression:  1. Abdominal pain, unspecified abdominal location      Data Unavailable   Final Clinical Impression(s) / ED Diagnoses Final diagnoses:  Abdominal pain, unspecified abdominal location    Rx / DC Orders ED Discharge Orders     None         Glyn Ade, MD 05/10/22 2317

## 2022-05-10 NOTE — ED Provider Triage Note (Signed)
Emergency Medicine Provider Triage Evaluation Note  Rachel Simon , a 27 y.o. female  was evaluated in triage.  Pt complains of abdominal pain that started one week ago.  Pt reports increasing pain.   Review of Systems  Positive: Pain Negative: No fever or vomiting  Physical Exam  BP 111/64   Pulse 67   Temp 98.4 F (36.9 C) (Oral)   Resp 17   SpO2 99%  Gen:   Awake, no distress   Resp:  Normal effort  MSK:   Moves extremities without difficulty  Other:  Tender lower abdomen   Medical Decision Making  Medically screening exam initiated at 7:50 PM.  Appropriate orders placed.  Rachel Simon was informed that the remainder of the evaluation will be completed by another provider, this initial triage assessment does not replace that evaluation, and the importance of remaining in the ED until their evaluation is complete.     Elson Areas, [place] 05/10/22 2202

## 2022-05-10 NOTE — ED Triage Notes (Signed)
Patient reports abd pain x1 week. Denies n/v/d, denies any issues with urination or BM's. Last BM was today, normal per pt. Just endorses pain. Last period this month.

## 2022-11-04 ENCOUNTER — Inpatient Hospital Stay (HOSPITAL_COMMUNITY)
Admission: AD | Admit: 2022-11-04 | Discharge: 2022-11-05 | Disposition: A | Discharge status: Home / Self Care | Payer: Medicaid Other | Attending: Emergency Medicine | Admitting: Emergency Medicine

## 2022-11-04 DIAGNOSIS — O0932 Supervision of pregnancy with insufficient antenatal care, second trimester: Secondary | ICD-10-CM | POA: Diagnosis not present

## 2022-11-04 DIAGNOSIS — R1084 Generalized abdominal pain: Secondary | ICD-10-CM

## 2022-11-04 DIAGNOSIS — Z3490 Encounter for supervision of normal pregnancy, unspecified, unspecified trimester: Secondary | ICD-10-CM

## 2022-11-04 DIAGNOSIS — O26892 Other specified pregnancy related conditions, second trimester: Secondary | ICD-10-CM | POA: Diagnosis not present

## 2022-11-04 DIAGNOSIS — Z98891 History of uterine scar from previous surgery: Secondary | ICD-10-CM

## 2022-11-04 DIAGNOSIS — Z3A27 27 weeks gestation of pregnancy: Secondary | ICD-10-CM | POA: Insufficient documentation

## 2022-11-04 NOTE — Progress Notes (Signed)
EFM applied and assessing.  Patient c/o abdominal pain x 1 week.  States has appointment to establish prenatal care on Wednesday.  Approximately 6 months.  Abdomen measuring 25 weeks per RN. No bleeding or leakage of fluid.  Abdominal pain likely round ligament pain.

## 2022-11-04 NOTE — ED Notes (Signed)
No tele-interpreter available during triage for pt.

## 2022-11-04 NOTE — ED Provider Notes (Signed)
[place] EMERGENCY DEPARTMENT AT [facility] Provider Note   CSN: [id number] Arrival date & time: 11/04/22  2311     History {Add pertinent medical, surgical, social history, OB history to HPI:1} Chief Complaint  Patient presents with   Abdominal Pain    +Pregnancy     Rachel Simon is a 27 y.o. female.  27 year old female presents with concern for abdominal pain in pregnancy. Patient is G2P1 with LMP 04/30/22, no prenatal care. Denies nausea, vomiting, vaginal bleeding or leaking fluid. Pain has not resolved over the course of the week which prompted patient to come to the ER tonight, pain is not worsening or becoming more frequent. Denies back pain.  Interpreter service is not available, husband at bedside translating.        Home Medications Prior to Admission medications   Medication Sig Start Date End Date Taking? Authorizing Provider  acetaminophen (TYLENOL) 500 MG tablet Take 2 tablets (1,000 mg total) by mouth every 6 (six) hours. 06/19/21   Janyce Llanos, CNM  ferrous sulfate 325 (65 FE) MG tablet Take 1 tablet (325 mg total) by mouth 2 (two) times daily with a meal. 06/19/21   Wilson, Marny Lowenstein, CNM  ibuprofen (ADVIL) 600 MG tablet Take 1 tablet (600 mg total) by mouth every 6 (six) hours. 06/19/21   Janyce Llanos, CNM  ondansetron (ZOFRAN) 4 MG tablet Take 1 tablet (4 mg total) by mouth every 6 (six) hours as needed for nausea or vomiting. 05/10/22   Glyn Ade, MD  polyethylene glycol powder (GLYCOLAX/MIRALAX) 17 GM/SCOOP powder Take 17 g by mouth daily. 08/14/21   Minna Antis, MD  Prenatal Vit-Fe Fumarate-FA (PRENATAL VITAMIN) 27-0.8 MG TABS Take 1 tablet by mouth daily at 6 (six) AM. 02/23/21   Federico Flake, MD      Allergies    Patient has no known allergies.    Review of Systems   Review of Systems Level 5 caveat due to limited history from language barrier  Physical Exam Updated Vital Signs BP  101/64 (BP Location: Left Arm)   Pulse 71   Temp (!) 97.5 F (36.4 C) (Oral)   Resp 18   Ht 5' (1.524 m)   Wt 75.8 kg   SpO2 100%   BMI 32.61 kg/m  Physical Exam Vitals and nursing note reviewed.  Constitutional:      General: She is not in acute distress.    Appearance: She is well-developed. She is not diaphoretic.  HENT:     Head: Normocephalic and atraumatic.  Cardiovascular:     Rate and Rhythm: Normal rate and regular rhythm.     Heart sounds: Normal heart sounds.  Pulmonary:     Effort: Pulmonary effort is normal.     Breath sounds: Normal breath sounds.  Abdominal:     Tenderness: There is generalized abdominal tenderness.     Comments: Gravid, measuring 25cm from pubic symphysis to fundus, mildly tender without guarding or rebound   Musculoskeletal:     Right lower leg: No edema.     Left lower leg: No edema.  Skin:    General: Skin is warm and dry.     Findings: No erythema or rash.  Neurological:     Mental Status: She is alert and oriented to person, place, and time.  Psychiatric:        Behavior: Behavior normal.     ED Results / Procedures / Treatments   Labs (all labs  ordered are listed, but only abnormal results are displayed) Labs Reviewed - No data to display  EKG None  Radiology No results found.  Procedures Procedures  {Document cardiac monitor, telemetry assessment procedure when appropriate:1}  Medications Ordered in ED Medications - No data to display  ED Course/ Medical Decision Making/ A&P   {   Click here for ABCD2, HEART and other calculatorsREFRESH Note before signing :1}                              Medical Decision Making  This patient presents to the ED for concern of ***, this involves an extensive number of treatment options, and is a complaint that carries with it a high risk of complications and morbidity.  The differential diagnosis includes ***   Co morbidities that complicate the patient  evaluation  ***   Additional history obtained:  Additional history obtained from *** External records from outside source obtained and reviewed including ***   Lab Tests:  I Ordered, and personally interpreted labs.  The pertinent results include:  ***   Imaging Studies ordered:  I ordered imaging studies including ***  I independently visualized and interpreted imaging which showed *** I agree with the radiologist interpretation   Cardiac Monitoring: / EKG:  The patient was maintained on a cardiac monitor.  I personally viewed and interpreted the cardiac monitored which showed an underlying rhythm of: ***   Consultations Obtained:  I requested consultation with the rapid [facility] nurse,  and discussed lab and imaging findings as well as pertinent plan - they recommend: will come and monitor patient    Problem List / ED Course / Critical interventions / Medication management  *** I ordered medication including ***  for ***  Reevaluation of the patient after these medicines showed that the patient {resolved/improved/worsened:23923::"improved"} I have reviewed the patients home medicines and have made adjustments as needed   Social Determinants of Health:  ***   Test / Admission - Considered:  ***   {Document critical care time when appropriate:1} {Document review of labs and clinical decision tools ie heart score, Chads2Vasc2 etc:1}  {Document your independent review of radiology images, and any outside records:1} {Document your discussion with family members, caretakers, and with consultants:1} {Document social determinants of health affecting pt's care:1} {Document your decision making why or why not admission, treatments were needed:1} Final Clinical Impression(s) / ED Diagnoses Final diagnoses:  None    Rx / DC Orders ED Discharge Orders     None

## 2022-11-04 NOTE — ED Notes (Signed)
[facility] at bedside

## 2022-11-04 NOTE — Progress Notes (Signed)
Received call from [facility] regarding patient who presented with c/o abdominal pain.  Approximately 6 months pregnant with no PNC.

## 2022-11-04 NOTE — ED Triage Notes (Signed)
[facility] called

## 2022-11-04 NOTE — ED Triage Notes (Signed)
Pt reports lower abdominal pain x 1 week no vaginal bleeding. No hx of miscarriage. No fall. Sts being approx 6 months pregnant. No established ob care.  +FHT via bedside US.   Language barrier in triage. Tele-Interpreter Error. Pt taken back to Res B for further evaluation

## 2022-11-05 ENCOUNTER — Inpatient Hospital Stay (HOSPITAL_COMMUNITY): Payer: Medicaid Other

## 2022-11-05 ENCOUNTER — Encounter (HOSPITAL_COMMUNITY): Payer: Self-pay

## 2022-11-05 DIAGNOSIS — O0932 Supervision of pregnancy with insufficient antenatal care, second trimester: Secondary | ICD-10-CM

## 2022-11-05 DIAGNOSIS — O99891 Other specified diseases and conditions complicating pregnancy: Secondary | ICD-10-CM | POA: Diagnosis not present

## 2022-11-05 DIAGNOSIS — R109 Unspecified abdominal pain: Secondary | ICD-10-CM

## 2022-11-05 DIAGNOSIS — Z3A27 27 weeks gestation of pregnancy: Secondary | ICD-10-CM

## 2022-11-05 LAB — URINALYSIS, ROUTINE W REFLEX MICROSCOPIC
Bilirubin Urine: NEGATIVE
Glucose, UA: NEGATIVE mg/dL
Hgb urine dipstick: NEGATIVE
Ketones, ur: NEGATIVE mg/dL
Nitrite: NEGATIVE
Protein, ur: NEGATIVE mg/dL
Specific Gravity, Urine: 1.013 (ref 1.005–1.030)
pH: 6 (ref 5.0–8.0)

## 2022-11-05 LAB — LIPASE, BLOOD: Lipase: 102 U/L — ABNORMAL HIGH (ref 11–51)

## 2022-11-05 LAB — CBC WITH DIFFERENTIAL/PLATELET
Abs Immature Granulocytes: 0.06 10*3/uL (ref 0.00–0.07)
Basophils Absolute: 0 10*3/uL (ref 0.0–0.1)
Basophils Relative: 0 %
Eosinophils Absolute: 0 10*3/uL (ref 0.0–0.5)
Eosinophils Relative: 1 %
HCT: 26.4 % — ABNORMAL LOW (ref 36.0–46.0)
Hemoglobin: 8 g/dL — ABNORMAL LOW (ref 12.0–15.0)
Immature Granulocytes: 1 %
Lymphocytes Relative: 34 %
Lymphs Abs: 1.8 10*3/uL (ref 0.7–4.0)
MCH: 25.2 pg — ABNORMAL LOW (ref 26.0–34.0)
MCHC: 30.3 g/dL (ref 30.0–36.0)
MCV: 83 fL (ref 80.0–100.0)
Monocytes Absolute: 0.5 10*3/uL (ref 0.1–1.0)
Monocytes Relative: 9 %
Neutro Abs: 2.9 10*3/uL (ref 1.7–7.7)
Neutrophils Relative %: 55 %
Platelets: 226 10*3/uL (ref 150–400)
RBC: 3.18 MIL/uL — ABNORMAL LOW (ref 3.87–5.11)
RDW: 14.1 % (ref 11.5–15.5)
WBC: 5.3 10*3/uL (ref 4.0–10.5)
nRBC: 0 % (ref 0.0–0.2)

## 2022-11-05 LAB — HEPATITIS B SURFACE ANTIGEN: Hepatitis B Surface Ag: NONREACTIVE

## 2022-11-05 LAB — COMPREHENSIVE METABOLIC PANEL
ALT: 16 U/L (ref 0–44)
AST: 25 U/L (ref 15–41)
Albumin: 3.2 g/dL — ABNORMAL LOW (ref 3.5–5.0)
Alkaline Phosphatase: 77 U/L (ref 38–126)
Anion gap: 7 (ref 5–15)
BUN: 14 mg/dL (ref 6–20)
CO2: 21 mmol/L — ABNORMAL LOW (ref 22–32)
Calcium: 8.9 mg/dL (ref 8.9–10.3)
Chloride: 105 mmol/L (ref 98–111)
Creatinine, Ser: 0.63 mg/dL (ref 0.44–1.00)
GFR, Estimated: >60 mL/min (ref >60)
Glucose, Bld: 87 mg/dL (ref 70–99)
Potassium: 3.4 mmol/L — ABNORMAL LOW (ref 3.5–5.1)
Sodium: 133 mmol/L — ABNORMAL LOW (ref 135–145)
Total Bilirubin: 0.7 mg/dL (ref 0.3–1.2)
Total Protein: 6.7 g/dL (ref 6.5–8.1)

## 2022-11-05 LAB — WET PREP, GENITAL
Clue Cells Wet Prep HPF POC: NONE SEEN
Sperm: NONE SEEN
Trich, Wet Prep: NONE SEEN
WBC, Wet Prep HPF POC: >=10 — AB (ref <10)
Yeast Wet Prep HPF POC: NONE SEEN

## 2022-11-05 LAB — ABO/RH: ABO/RH(D): B POS

## 2022-11-05 LAB — GC/CHLAMYDIA PROBE AMP (~~LOC~~) NOT AT ARMC
Chlamydia: NEGATIVE
Comment: NEGATIVE
Comment: NORMAL
Neisseria Gonorrhea: NEGATIVE

## 2022-11-05 LAB — HIV ANTIBODY (ROUTINE TESTING W REFLEX): HIV Screen 4th Generation wRfx: NONREACTIVE

## 2022-11-05 LAB — RPR: RPR Ser Ql: NONREACTIVE

## 2022-11-05 NOTE — MAU Provider Note (Signed)
History     CSN: [id number]  Arrival date and time: 11/04/22 2311   Event Date/Time   First Provider Initiated Contact with Patient 11/05/22 0142      Chief Complaint  Patient presents with   Abdominal Pain    +Pregnancy    HPI  OB History     Gravida  2   Para  1   Term  1   Preterm      AB      Living  1      SAB      IAB      Ectopic      Multiple  0   Live Births  1           Past Medical History:  Diagnosis Date   Anemia 02/23/2021   Heart murmur 02/23/2021   IDA (iron deficiency anemia)     Past Surgical History:  Procedure Laterality Date   CESAREAN SECTION  06/17/2021   Procedure: CESAREAN SECTION;  Surgeon: Christeen Douglas, MD;  Location: [facility] ORS;  Service: Obstetrics;;    Family History  Problem Relation Age of Onset   GI problems Father     Social History   Tobacco Use   Smoking status: Never    Passive exposure: Never   Smokeless tobacco: Never  Vaping Use   Vaping status: Never Used  Substance Use Topics   Alcohol use: Never   Drug use: Never    Allergies: No Known Allergies  Medications Prior to Admission  Medication Sig Dispense Refill Last Dose   acetaminophen (TYLENOL) 500 MG tablet Take 2 tablets (1,000 mg total) by mouth every 6 (six) hours. 30 tablet 0    ferrous sulfate 325 (65 FE) MG tablet Take 1 tablet (325 mg total) by mouth 2 (two) times daily with a meal.  3    ibuprofen (ADVIL) 600 MG tablet Take 1 tablet (600 mg total) by mouth every 6 (six) hours. 30 tablet 0    ondansetron (ZOFRAN) 4 MG tablet Take 1 tablet (4 mg total) by mouth every 6 (six) hours as needed for nausea or vomiting. 20 tablet 0    polyethylene glycol powder (GLYCOLAX/MIRALAX) 17 GM/SCOOP powder Take 17 g by mouth daily. 255 g 0    Prenatal Vit-Fe Fumarate-FA (PRENATAL VITAMIN) 27-0.8 MG TABS Take 1 tablet by mouth daily at 6 (six) AM. 100 tablet 0     Review of Systems  Constitutional:  Negative for chills, fatigue, fever and  unexpected weight change.  Respiratory:  Negative for cough and shortness of breath.   Cardiovascular:  Negative for chest pain and palpitations.  Gastrointestinal:  Positive for abdominal pain. Negative for constipation, diarrhea, nausea and vomiting.  Genitourinary:  Negative for difficulty urinating, flank pain, frequency and urgency.  Neurological:  Negative for dizziness and headaches.  Psychiatric/Behavioral:  Negative for confusion and suicidal ideas.    Physical Exam   Blood pressure (!) 111/59, pulse 68, temperature 98 F (36.7 C), temperature source Oral, resp. rate 16, height 5' (1.524 m), weight 66.2 kg, last menstrual period 04/30/2022, SpO2 98%, currently breastfeeding.  Physical Exam Vitals reviewed.  Constitutional:      Appearance: Normal appearance.  HENT:     Head: Normocephalic.  Cardiovascular:     Rate and Rhythm: Normal rate and regular rhythm.     Pulses: Normal pulses.     Heart sounds: Normal heart sounds.  Pulmonary:     Effort: Pulmonary effort is normal.  Breath sounds: Normal breath sounds.  Abdominal:     General: Abdomen is flat. Bowel sounds are normal.     Palpations: Abdomen is soft.  Genitourinary:    Cervix: Dilated.     Comments: Cervix 1 outer os, funneling to closed. Thick. High.  Skin:    General: Skin is warm and dry.     Capillary Refill: Capillary refill takes less than 2 seconds.  Neurological:     Mental Status: She is alert and oriented to person, place, and time.  Psychiatric:        Mood and Affect: Mood normal.        Behavior: Behavior normal.        Thought Content: Thought content normal.        Judgment: Judgment normal.     Fetal Assessment 125 bpm, Mod Var, -Decels, +Accels Toco: UI  MAU Course   Results for orders placed or performed during the hospital encounter of 11/04/22 (from the past 24 hour(s))  CBC with Differential     Status: Abnormal   Collection Time: 11/04/22 11:39 PM  Result Value Ref Range    WBC 5.3 4.0 - 10.5 K/uL   RBC 3.18 (L) 3.87 - 5.11 MIL/uL   Hemoglobin 8.0 (L) 12.0 - 15.0 g/dL   HCT 16.1 (L) 09.6 - 04.5 %   MCV 83.0 80.0 - 100.0 fL   MCH 25.2 (L) 26.0 - 34.0 pg   MCHC 30.3 30.0 - 36.0 g/dL   RDW 40.9 81.1 - 91.4 %   Platelets 226 150 - 400 K/uL   nRBC 0.0 0.0 - 0.2 %   Neutrophils Relative % 55 %   Neutro Abs 2.9 1.7 - 7.7 K/uL   Lymphocytes Relative 34 %   Lymphs Abs 1.8 0.7 - 4.0 K/uL   Monocytes Relative 9 %   Monocytes Absolute 0.5 0.1 - 1.0 K/uL   Eosinophils Relative 1 %   Eosinophils Absolute 0.0 0.0 - 0.5 K/uL   Basophils Relative 0 %   Basophils Absolute 0.0 0.0 - 0.1 K/uL   Immature Granulocytes 1 %   Abs Immature Granulocytes 0.06 0.00 - 0.07 K/uL  Comprehensive metabolic panel     Status: Abnormal   Collection Time: 11/04/22 11:39 PM  Result Value Ref Range   Sodium 133 (L) 135 - 145 mmol/L   Potassium 3.4 (L) 3.5 - 5.1 mmol/L   Chloride 105 98 - 111 mmol/L   CO2 21 (L) 22 - 32 mmol/L   Glucose, Bld 87 70 - 99 mg/dL   BUN 14 6 - 20 mg/dL   Creatinine, Ser 7.82 0.44 - 1.00 mg/dL   Calcium 8.9 8.9 - 95.6 mg/dL   Total Protein 6.7 6.5 - 8.1 g/dL   Albumin 3.2 (L) 3.5 - 5.0 g/dL   AST 25 15 - 41 U/L   ALT 16 0 - 44 U/L   Alkaline Phosphatase 77 38 - 126 U/L   Total Bilirubin 0.7 0.3 - 1.2 mg/dL   GFR, Estimated >21 >30 mL/min   Anion gap 7 5 - 15  Lipase, blood     Status: Abnormal   Collection Time: 11/04/22 11:39 PM  Result Value Ref Range   Lipase 102 (H) 11 - 51 U/L  Urinalysis, Routine w reflex microscopic -Urine, Clean Catch     Status: Abnormal   Collection Time: 11/05/22  1:01 AM  Result Value Ref Range   Color, Urine YELLOW YELLOW   APPearance HAZY (A) CLEAR  Specific Gravity, Urine 1.013 1.005 - 1.030   pH 6.0 5.0 - 8.0   Glucose, UA NEGATIVE NEGATIVE mg/dL   Hgb urine dipstick NEGATIVE NEGATIVE   Bilirubin Urine NEGATIVE NEGATIVE   Ketones, ur NEGATIVE NEGATIVE mg/dL   Protein, ur NEGATIVE NEGATIVE mg/dL   Nitrite  NEGATIVE NEGATIVE   Leukocytes,Ua LARGE (A) NEGATIVE   RBC / HPF 0-5 0 - 5 RBC/hpf   WBC, UA 21-50 0 - 5 WBC/hpf   Bacteria, UA FEW (A) NONE SEEN   Squamous Epithelial / HPF 11-20 0 - 5 /HPF   Mucus PRESENT   Wet prep, genital     Status: Abnormal   Collection Time: 11/05/22  3:24 AM  Result Value Ref Range   Yeast Wet Prep HPF POC NONE SEEN NONE SEEN   Trich, Wet Prep NONE SEEN NONE SEEN   Clue Cells Wet Prep HPF POC NONE SEEN NONE SEEN   WBC, Wet Prep HPF POC >=10 (A) <10   Sperm NONE SEEN   ABO/Rh     Status: None   Collection Time: 11/05/22  3:28 AM  Result Value Ref Range   ABO/RH(D) B POS    No rh immune globuloin      NOT A RH IMMUNE GLOBULIN CANDIDATE, PT RH POSITIVE Performed at [facility], [address] [place], [place] [address]    No results found.  MDM PE: Benign Labs: Interpreted CBC, CMP, ordered full prenatal workup, ultrasound EFM: Reactive NST  Assessment and Plan  27yo  G2 P1  SIUP at 27 weeks Cat 1 FT  - Exam findings discussed. - Importance of prenatal discussed. Provider list provided.  States she has an appointment this week at [facility].  - Prenatal workup completed due to no prenatal care. - Safe meds list given. - Interpreter used - phone and partner. - Discharged from MAU in stable condition. - May return to MAU as needed.    Richardson Landry MSN, CNM 11/05/2022, 4:32 AM

## 2022-11-05 NOTE — Progress Notes (Signed)
Spoke with Dr Shawnie Pons regarding patient.  Notified of complaints and EFM tracing.  Will have patient go to MAU for further evaluation.

## 2022-11-05 NOTE — ED Notes (Signed)
Ambulated out of department with significant other to present to MAU for POV transfer.

## 2022-11-05 NOTE — ED Notes (Signed)
Report given Perla RN at [facility]

## 2022-11-05 NOTE — MAU Note (Signed)
Rachel Simon is a 27 y.o. at [redacted]w[redacted]d here in MAU reporting: Pt states she is 6 mo pregnant with no PNC. Pt states she has an OB appt on Wends. Pt complains of lower abdominal pain that feels like cramping. Pt reports this pain has been happening for the past week. +FM. Pt denies LOF or VB.  LMP: 04/30/2022 Onset of complaint: a week  Pain score: 7/10 Vitals:   11/04/22 2330 11/05/22 0124  BP: 110/64 (!) 111/59  Pulse: 72 68  Resp: 16 16  Temp:  98 F (36.7 C)  SpO2: 100% 98%     FHT:147 Lab orders placed from triage:

## 2022-11-05 NOTE — Discharge Instructions (Addendum)
[facility] [employer] for [employer] at [employer] [facility]             [address] [place], [place] [address] [phone]  Center for [employer] at [facility]                                                             [address], [address], [place], [place], [address] [phone]  Center for [facility] at [facility] [address], [place], [place], [address] [phone]  Center for [facility] at [facility] [address], [address], [place], [place], [address] [phone]  Center for [facility] at [facility]                                 [address] [place], [place], [place], [address] [phone]  Center for [facility] at [facility]                                    [address] [place], [place], [address] [phone]  Center for [facility] at [facility] [address], [address], [place], [place], [address]                              [facility] [place] [address], [address], [place], [place], [address] [phone]  [facility]         Phone: [phone]  [facility]      Phone: [phone]   [facility]      Phone: [phone]  [facility] Department-Family Planning         Phone: [phone]   [facility] Health Department-Maternity    Phone: [phone]  [facility]      Phone: [phone]  [facility] [place]     Phone: [phone]  [facility]        Phone: [phone]  [facility]) [phone]  [facility]      Phone: [phone]                        Safe Medications in Pregnancy    Acne: Benzoyl Peroxide Salicylic Acid  Backache/Headache: Tylenol: 2 regular strength every 4 hours OR               2 Extra strength every 6 hours  Colds/Coughs/Allergies: Benadryl (alcohol free) 25 mg every 6 hours as needed Breath right strips Claritin Cepacol throat lozenges Chloraseptic throat spray Cold-Eeze- up to three times per day Cough drops, alcohol free Flonase (by prescription only) Guaifenesin Mucinex Robitussin DM (plain only, alcohol free) Saline nasal spray/drops Sudafed (pseudoephedrine) & Actifed ** use only after [redacted] weeks gestation and if you do not have high blood pressure Tylenol Vicks Vaporub  Zinc lozenges Zyrtec   Constipation: Colace Ducolax suppositories Fleet enema Glycerin suppositories Metamucil Milk of magnesia Miralax Senokot Smooth move tea  Diarrhea: Kaopectate Imodium A-D  *NO pepto Bismol  Hemorrhoids: Anusol Anusol HC Preparation H Tucks  Indigestion: Tums Maalox Mylanta Zantac  Pepcid  Insomnia: Benadryl (alcohol free) 25mg  every 6 hours as needed Tylenol PM Unisom, no Gelcaps  Leg Cramps: Tums MagGel  Nausea/Vomiting:  Bonine Dramamine Emetrol Ginger extract Sea bands Meclizine  Nausea medication to take during pregnancy:  Unisom (doxylamine succinate 25 mg tablets) Take one tablet daily at bedtime. If symptoms are not adequately controlled, the dose can be increased to a maximum recommended dose of two tablets daily (1/2 tablet in the morning, 1/2 tablet mid-afternoon and one at bedtime). Vitamin B6 100mg  tablets. Take one tablet twice a day (up to 200 mg per day).  Skin Rashes: Aveeno products Benadryl cream or 25mg  every 6 hours as needed Calamine Lotion 1% cortisone cream  Yeast infection: Gyne-lotrimin 7 Monistat 7   **If taking multiple medications, please check labels to avoid duplicating the same active ingredients **take medication as directed on the label ** Do not exceed 4000 mg of tylenol in 24 hours **Do not take medications that contain aspirin or ibuprofen

## 2022-11-06 LAB — RUBELLA SCREEN: Rubella: 17.9 {index} (ref >0.99)

## 2023-01-23 NOTE — L&D Delivery Note (Signed)
 Delivery Note ROM x 3h 16m. Progressed quickly. Pushed x 30 minutes. At 10:43 AM a viable and healthy female was delivered via VBAC, Spontaneous (Presentation: Right Occiput Anterior).  APGAR: 8, 9; weight 7 lb 8.3 oz (3410 g). Shoulders delivered easily. Infant placed skin-to-skin w/ mom. Pitocin  bolus and TXA given prophylactically to limit bleeding considering pts start hgb of 8. Delayed cord clamping x 1 minute. Cord clamped x 2 and cut by FOB. Moderate gush of blood noted a few minutes after delivery that resolved quickly with delivery of placenta. Placenta status: Spontaneous, Intact.  Cord: 3 vessels with the following complications: tight nuchal. Delivered via summersault maneuver.  Cord pH: NA  Anesthesia: Local Episiotomy: None Lacerations: Deep 2nd degree perineal laceration that superficially extended to anal sphincter. Dr. Zina called to [facility] to assess. Not a 3rd degree lac but vaginal rectal wall was found to be very thin. Dr. Zina performed repair. Area of laceration at vaginal apex continued to ooze despite figure-of-eight sutures. Pt not tolerating suturing and holding pressure well after Lidocaine  and Fentanyl  IV do IV Dilaudid  was given. Uterine sweep performed in case that was cause of bleeding but it was not. Bladder was drained w/ I&O cath. Uterus remained firm. Vaginal lac continued to ooze. Pressure hekd for 10 more minutes. Another figure-of-eight suture placed. Bleeding decreased significantly. Observed fore 5 mor eminutes. Scant bleeding.   Suture Repair: 2.0 3.0 vicryl vicryl rapide Est. Blood Loss (mL): 930  Jada not placed because bleeding was not from atony. Considered packing vagina but bleeding was eventually controlled without it and pt had been very intolerant and gauze in vagina.   Check CBC and DIC panel now due to significant oozing. Pt previously consented for blood. Informed that it will likely be indicated and instructed her not to get out bed without assistance from  RN. VSS.   Mom to postpartum.  Baby to Couplet care / Skin to Skin. Placenta to: LD Feeding: Breast/Bottle Circ: IP Contraception: Undecided SW consult due to no prenatal care  Kirbi Farrugia 01/29/2023, 12:26 PM

## 2023-01-29 ENCOUNTER — Encounter (HOSPITAL_COMMUNITY): Payer: Self-pay | Admitting: *Deleted

## 2023-01-29 ENCOUNTER — Inpatient Hospital Stay (HOSPITAL_COMMUNITY)
Admission: EM | Admit: 2023-01-29 | Discharge: 2023-01-31 | DRG: 806 | Disposition: A | Discharge status: Home / Self Care | Payer: Medicaid Other | Attending: Obstetrics and Gynecology | Admitting: Obstetrics and Gynecology

## 2023-01-29 ENCOUNTER — Other Ambulatory Visit: Payer: Self-pay

## 2023-01-29 DIAGNOSIS — Z23 Encounter for immunization: Secondary | ICD-10-CM

## 2023-01-29 DIAGNOSIS — O34219 Maternal care for unspecified type scar from previous cesarean delivery: Secondary | ICD-10-CM | POA: Diagnosis present

## 2023-01-29 DIAGNOSIS — O9081 Anemia of the puerperium: Secondary | ICD-10-CM | POA: Diagnosis not present

## 2023-01-29 DIAGNOSIS — Z603 Acculturation difficulty: Secondary | ICD-10-CM | POA: Diagnosis present

## 2023-01-29 DIAGNOSIS — D509 Iron deficiency anemia, unspecified: Secondary | ICD-10-CM | POA: Diagnosis present

## 2023-01-29 DIAGNOSIS — D62 Acute posthemorrhagic anemia: Secondary | ICD-10-CM | POA: Insufficient documentation

## 2023-01-29 DIAGNOSIS — Z3A39 39 weeks gestation of pregnancy: Secondary | ICD-10-CM | POA: Diagnosis not present

## 2023-01-29 DIAGNOSIS — O34211 Maternal care for low transverse scar from previous cesarean delivery: Secondary | ICD-10-CM | POA: Diagnosis not present

## 2023-01-29 DIAGNOSIS — O26893 Other specified pregnancy related conditions, third trimester: Secondary | ICD-10-CM | POA: Diagnosis present

## 2023-01-29 LAB — GROUP B STREP BY PCR: Group B strep by PCR: POSITIVE — AB

## 2023-01-29 LAB — CBC
HCT: 26.7 % — ABNORMAL LOW (ref 36.0–46.0)
HCT: 26.5 % — ABNORMAL LOW (ref 36.0–46.0)
HCT: 26.2 % — ABNORMAL LOW (ref 36.0–46.0)
Hemoglobin: 8 g/dL — ABNORMAL LOW (ref 12.0–15.0)
Hemoglobin: 7.9 g/dL — ABNORMAL LOW (ref 12.0–15.0)
Hemoglobin: 7.9 g/dL — ABNORMAL LOW (ref 12.0–15.0)
MCH: 22.5 pg — ABNORMAL LOW (ref 26.0–34.0)
MCH: 22.3 pg — ABNORMAL LOW (ref 26.0–34.0)
MCH: 22.2 pg — ABNORMAL LOW (ref 26.0–34.0)
MCHC: 30 g/dL (ref 30.0–36.0)
MCHC: 29.8 g/dL — ABNORMAL LOW (ref 30.0–36.0)
MCHC: 30.2 g/dL (ref 30.0–36.0)
MCV: 75.2 fL — ABNORMAL LOW (ref 80.0–100.0)
MCV: 74.6 fL — ABNORMAL LOW (ref 80.0–100.0)
MCV: 73.6 fL — ABNORMAL LOW (ref 80.0–100.0)
Platelets: 218 10*3/uL (ref 150–400)
Platelets: 224 10*3/uL (ref 150–400)
Platelets: 234 10*3/uL (ref 150–400)
RBC: 3.55 MIL/uL — ABNORMAL LOW (ref 3.87–5.11)
RBC: 3.55 MIL/uL — ABNORMAL LOW (ref 3.87–5.11)
RBC: 3.56 MIL/uL — ABNORMAL LOW (ref 3.87–5.11)
RDW: 18 % — ABNORMAL HIGH (ref 11.5–15.5)
RDW: 17.7 % — ABNORMAL HIGH (ref 11.5–15.5)
RDW: 17.8 % — ABNORMAL HIGH (ref 11.5–15.5)
WBC: 12 10*3/uL — ABNORMAL HIGH (ref 4.0–10.5)
WBC: 15.5 10*3/uL — ABNORMAL HIGH (ref 4.0–10.5)
WBC: 16.1 10*3/uL — ABNORMAL HIGH (ref 4.0–10.5)
nRBC: 0.5 % — ABNORMAL HIGH (ref 0.0–0.2)
nRBC: 0.3 % — ABNORMAL HIGH (ref 0.0–0.2)
nRBC: 0.2 % (ref 0.0–0.2)

## 2023-01-29 LAB — COMPREHENSIVE METABOLIC PANEL
ALT: 13 U/L (ref 0–44)
AST: 26 U/L (ref 15–41)
Albumin: 3.2 g/dL — ABNORMAL LOW (ref 3.5–5.0)
Alkaline Phosphatase: 212 U/L — ABNORMAL HIGH (ref 38–126)
Anion gap: 10 (ref 5–15)
BUN: 10 mg/dL (ref 6–20)
CO2: 19 mmol/L — ABNORMAL LOW (ref 22–32)
Calcium: 8.7 mg/dL — ABNORMAL LOW (ref 8.9–10.3)
Chloride: 102 mmol/L (ref 98–111)
Creatinine, Ser: 0.54 mg/dL (ref 0.44–1.00)
GFR, Estimated: >60 mL/min (ref >60)
Glucose, Bld: 101 mg/dL — ABNORMAL HIGH (ref 70–99)
Potassium: 3.3 mmol/L — ABNORMAL LOW (ref 3.5–5.1)
Sodium: 131 mmol/L — ABNORMAL LOW (ref 135–145)
Total Bilirubin: 0.5 mg/dL (ref 0.0–1.2)
Total Protein: 7.1 g/dL (ref 6.5–8.1)

## 2023-01-29 LAB — RPR: RPR Ser Ql: NONREACTIVE

## 2023-01-29 LAB — DIC (DISSEMINATED INTRAVASCULAR COAGULATION)PANEL
D-Dimer, Quant: 3.45 ug{FEU}/mL — ABNORMAL HIGH (ref 0.00–0.50)
Fibrinogen: 373 mg/dL (ref 210–475)
INR: 1.2 (ref 0.8–1.2)
Platelets: 224 10*3/uL (ref 150–400)
Prothrombin Time: 15.1 s (ref 11.4–15.2)
Smear Review: NONE SEEN
aPTT: 29 s (ref 24–36)

## 2023-01-29 LAB — RAPID URINE DRUG SCREEN, HOSP PERFORMED
Amphetamines: NOT DETECTED
Barbiturates: NOT DETECTED
Benzodiazepines: NOT DETECTED
Cocaine: NOT DETECTED
Opiates: NOT DETECTED
Tetrahydrocannabinol: NOT DETECTED

## 2023-01-29 LAB — HIV ANTIBODY (ROUTINE TESTING W REFLEX): HIV Screen 4th Generation wRfx: NONREACTIVE

## 2023-01-29 LAB — PREPARE RBC (CROSSMATCH)

## 2023-01-29 LAB — ABO/RH: ABO/RH(D): B POS

## 2023-01-29 MED ORDER — SODIUM CHLORIDE 0.9% IV SOLUTION: Freq: Once | INTRAVENOUS | Status: DC

## 2023-01-29 MED ORDER — DIBUCAINE (PERIANAL) 1 % EX OINT: 1.0000 | TOPICAL_OINTMENT | CUTANEOUS | Status: DC | PRN

## 2023-01-29 MED ORDER — SIMETHICONE 80 MG PO CHEW
80.0000 mg | CHEWABLE_TABLET | ORAL | Status: DC | PRN
Start: 2023-01-29 — End: 2023-01-31

## 2023-01-29 MED ORDER — METHYLERGONOVINE MALEATE 0.2 MG PO TABS
0.2000 mg | ORAL_TABLET | ORAL | Status: AC
Start: 2023-01-29 — End: 2023-01-30
  Administered 2023-01-29 – 2023-01-30 (×6): 0.2 mg via ORAL
  Filled 2023-01-29 (×6): qty 1

## 2023-01-29 MED ORDER — METHYLERGONOVINE MALEATE 0.2 MG PO TABS: 0.2000 mg | ORAL_TABLET | ORAL | Status: DC | PRN

## 2023-01-29 MED ORDER — FLEET ENEMA RE ENEM: 1.0000 | ENEMA | RECTAL | Status: DC | PRN

## 2023-01-29 MED ORDER — OXYCODONE-ACETAMINOPHEN 5-325 MG PO TABS
2.0000 | ORAL_TABLET | ORAL | Status: DC | PRN
Start: 2023-01-29 — End: 2023-01-29

## 2023-01-29 MED ORDER — OXYCODONE-ACETAMINOPHEN 5-325 MG PO TABS
1.0000 | ORAL_TABLET | ORAL | Status: DC | PRN
Start: 2023-01-29 — End: 2023-01-29

## 2023-01-29 MED ORDER — IBUPROFEN 600 MG PO TABS
600.0000 mg | ORAL_TABLET | Freq: Four times a day (QID) | ORAL | Status: DC
  Administered 2023-01-29 – 2023-01-31 (×8): 600 mg via ORAL
  Filled 2023-01-29 (×8): qty 1

## 2023-01-29 MED ORDER — INFLUENZA VIRUS VACC SPLIT PF (FLUZONE) 0.5 ML IM SUSY: 0.5000 mL | PREFILLED_SYRINGE | INTRAMUSCULAR | Status: DC

## 2023-01-29 MED ORDER — OXYCODONE HCL 5 MG PO TABS
10.0000 mg | ORAL_TABLET | ORAL | Status: DC | PRN
Start: 2023-01-29 — End: 2023-01-31

## 2023-01-29 MED ORDER — FENTANYL CITRATE (PF) 100 MCG/2ML IJ SOLN
100.0000 ug | INTRAMUSCULAR | Status: DC | PRN
  Administered 2023-01-29 (×2): 100 ug via INTRAVENOUS
  Filled 2023-01-29 (×2): qty 2

## 2023-01-29 MED ORDER — OXYTOCIN BOLUS FROM INFUSION
333.0000 mL | Freq: Once | INTRAVENOUS | Status: AC
  Administered 2023-01-29: 333 mL via INTRAVENOUS

## 2023-01-29 MED ORDER — LACTATED RINGERS IV SOLN: INTRAVENOUS | Status: DC

## 2023-01-29 MED ORDER — ONDANSETRON HCL 4 MG PO TABS: 4.0000 mg | ORAL_TABLET | ORAL | Status: DC | PRN

## 2023-01-29 MED ORDER — ACETAMINOPHEN 325 MG PO TABS: 650.0000 mg | ORAL_TABLET | ORAL | Status: DC | PRN

## 2023-01-29 MED ORDER — DIPHENHYDRAMINE HCL 25 MG PO CAPS: 25.0000 mg | ORAL_CAPSULE | Freq: Four times a day (QID) | ORAL | Status: DC | PRN

## 2023-01-29 MED ORDER — ONDANSETRON HCL 4 MG/2ML IJ SOLN: 4.0000 mg | INTRAMUSCULAR | Status: DC | PRN

## 2023-01-29 MED ORDER — LACTATED RINGERS IV BOLUS
1000.0000 mL | Freq: Once | INTRAVENOUS | Status: AC
  Administered 2023-01-29: 1000 mL via INTRAVENOUS

## 2023-01-29 MED ORDER — COCONUT OIL OIL: 1.0000 | TOPICAL_OIL | Status: DC | PRN

## 2023-01-29 MED ORDER — SOD CITRATE-CITRIC ACID 500-334 MG/5ML PO SOLN: 30.0000 mL | ORAL | Status: DC | PRN

## 2023-01-29 MED ORDER — TETANUS-DIPHTH-ACELL PERTUSSIS 5-2.5-18.5 LF-MCG/0.5 IM SUSY
0.5000 mL | PREFILLED_SYRINGE | Freq: Once | INTRAMUSCULAR | Status: AC
  Administered 2023-01-31: 0.5 mL via INTRAMUSCULAR
  Filled 2023-01-29 (×2): qty 0.5

## 2023-01-29 MED ORDER — PRENATAL MULTIVITAMIN CH
1.0000 | ORAL_TABLET | Freq: Every day | ORAL | Status: DC
  Administered 2023-01-30 – 2023-01-31 (×2): 1 via ORAL
  Filled 2023-01-29 (×2): qty 1

## 2023-01-29 MED ORDER — BENZOCAINE-MENTHOL 20-0.5 % EX AERO
1.0000 | INHALATION_SPRAY | CUTANEOUS | Status: DC | PRN
  Administered 2023-01-29: 1 via TOPICAL
  Filled 2023-01-29: qty 56

## 2023-01-29 MED ORDER — LIDOCAINE HCL (PF) 1 % IJ SOLN
30.0000 mL | INTRAMUSCULAR | Status: AC | PRN
  Administered 2023-01-29: 30 mL via SUBCUTANEOUS
  Filled 2023-01-29: qty 30

## 2023-01-29 MED ORDER — LACTATED RINGERS IV SOLN: 500.0000 mL | INTRAVENOUS | Status: DC | PRN

## 2023-01-29 MED ORDER — MAGNESIUM HYDROXIDE 400 MG/5ML PO SUSP: 30.0000 mL | ORAL | Status: DC | PRN

## 2023-01-29 MED ORDER — OXYCODONE HCL 5 MG PO TABS
5.0000 mg | ORAL_TABLET | ORAL | Status: DC | PRN
Start: 2023-01-29 — End: 2023-01-31

## 2023-01-29 MED ORDER — MEASLES, MUMPS & RUBELLA VAC IJ SOLR: 0.5000 mL | Freq: Once | INTRAMUSCULAR | Status: DC

## 2023-01-29 MED ORDER — METHYLERGONOVINE MALEATE 0.2 MG/ML IJ SOLN
0.2000 mg | INTRAMUSCULAR | Status: DC | PRN
  Administered 2023-01-29: 0.2 mg via INTRAMUSCULAR

## 2023-01-29 MED ORDER — TRANEXAMIC ACID-NACL 1000-0.7 MG/100ML-% IV SOLN
INTRAVENOUS | Status: AC
  Administered 2023-01-29: 1000 mg
  Filled 2023-01-29: qty 100

## 2023-01-29 MED ORDER — TRANEXAMIC ACID-NACL 1000-0.7 MG/100ML-% IV SOLN: 1000.0000 mg | INTRAVENOUS | Status: DC

## 2023-01-29 MED ORDER — ONDANSETRON HCL 4 MG/2ML IJ SOLN: 4.0000 mg | Freq: Four times a day (QID) | INTRAMUSCULAR | Status: DC | PRN

## 2023-01-29 MED ORDER — HYDROMORPHONE HCL 1 MG/ML IJ SOLN
1.0000 mg | Freq: Once | INTRAMUSCULAR | Status: AC
  Administered 2023-01-29: 1 mg via INTRAVENOUS
  Filled 2023-01-29: qty 1

## 2023-01-29 MED ORDER — ACETAMINOPHEN 500 MG PO TABS: 1000.0000 mg | ORAL_TABLET | Freq: Four times a day (QID) | ORAL | Status: DC | PRN

## 2023-01-29 MED ORDER — WITCH HAZEL-GLYCERIN EX PADS: 1.0000 | MEDICATED_PAD | CUTANEOUS | Status: DC | PRN

## 2023-01-29 MED ORDER — OXYTOCIN-SODIUM CHLORIDE 30-0.9 UT/500ML-% IV SOLN
2.5000 [IU]/h | INTRAVENOUS | Status: DC
  Administered 2023-01-29: 2.5 [IU]/h via INTRAVENOUS
  Filled 2023-01-29: qty 500

## 2023-01-29 NOTE — Progress Notes (Signed)
 Care Link here.  Transport to L+D 218.  J Cox RNC updated on patient status.

## 2023-01-29 NOTE — Progress Notes (Signed)
 Nursing staff offered to order pt dinner meal from hospital cafeteria.  Pt declined, her and her husband stated that her spouse is bringing her food.

## 2023-01-29 NOTE — Lactation Note (Addendum)
 This note was copied from a baby's chart. Lactation Consultation Note  Patient Name: Rachel Simon Unijb'd Date: 01/29/2023 Age:26 hours  Reason for consult: L&D Initial assessment;Term;Breastfeeding assistance  P2, [redacted]w[redacted]d, Speaks Swahili  [facility] [address] #[id number]  Initial LC visit to see P2 mother of term infant. Mother is breast and formula by preference. She was receptive to [facility] assistance with latch. Mother demonstrated independence with latching baby in a cradle hold. She was able to obtain a deep latch. Baby suckled briefly. He was gagging and pushed nipple out. Discussed with mother that he may have amniotic fluid that he has ingested that is making him feel full. Advised just to breast feed him for the next few hours or until he is not spitty or gagging.   Mother encouraged to latch baby with feeding cues, place baby skin to skin if not latching, and call for assistance with breastfeeding as needed. Recommend breastfeeding before offering formula. Formula guidelines at bedside with formula. Mother verbalized understanding.     Maternal Data Has patient been taught Hand Expression?: Yes Does the patient have breastfeeding experience prior to this delivery?: Yes How long did the patient breastfeed?: 9 months  Feeding Mother's Current Feeding Choice: Breast Milk and Formula Nipple Type: Regular  LATCH Score Latch: Repeated attempts needed to sustain latch, nipple held in mouth throughout feeding, stimulation needed to elicit sucking reflex.  Audible Swallowing: A few with stimulation/ stopped due to gagging  Type of Nipple: Everted at rest and after stimulation  Comfort (Breast/Nipple): Soft / non-tender  Hold (Positioning): No assistance needed to correctly position infant at breast.  LATCH Score: 8      Interventions Interventions: Breast feeding basics reviewed;Assisted with latch;Hand express;Support pillows;Education;LC Services brochure (CDC  guidelines for milk storage and preparation)     Consult Status Consult Status: Follow-up Date: 01/30/23 Follow-up type: In-patient    Joshua Line M 01/29/2023, 3:12 PM

## 2023-01-29 NOTE — Progress Notes (Signed)
 Patient still waiting on husband to return to discuss blood consent and transfusion. Pt remains asymptomatic and VSS. Will update MD.

## 2023-01-29 NOTE — Progress Notes (Signed)
 During assessment pt states she was feeling dizzy right before RN entered room.  Pt denies any visual changes, pain, or respiratory distress and states she wanted to use restroom.  RN assisted pt ambulating to restroom to void and completed per care. Vitals WDL.  Pt declined feeling dizzy during ambulation.  RN informed Dr. Loyola of faculty practice who stated they will order blood to be transfused.

## 2023-01-29 NOTE — Progress Notes (Signed)
 Patient ID: Rachel Simon, female   DOB: Sep 26, 1995, 28 y.o.   MRN: [id number] RN called to inform provider that pt became dizzy, weak and hot when getting up to the bathroom. Nml lochia. VSS. Pt was known to have starting Hgb 8.0 and QBL 930. CBC ordered and will transfuse 2 Units PRBCs.   Rachel Simon  Rachel Simon 01/29/2023 6:39 PM

## 2023-01-29 NOTE — Discharge Summary (Addendum)
 Postpartum Discharge Summary     Patient Name: Rachel Simon DOB: 1995/06/09 MRN: [id number]  Date of admission: 01/29/2023 Delivery date:01/29/2023 Delivering provider: [name], [place]  Date of discharge: 01/31/2023  Admitting diagnosis: Labor and delivery indication for care or intervention [O75.9] Intrauterine pregnancy: [redacted]w[redacted]d     Secondary diagnosis:  Active Problems:   IDA (iron  deficiency anemia)   Vaginal delivery   VBAC (vaginal birth after Cesarean)  Additional problems: Acute on chronic blood loss anemia    Discharge diagnosis: Term Pregnancy Delivered, VBAC, and Anemia                                              Post partum procedures: recommended blood transfusion- pt declined Augmentation:  None Complications: None  Hospital course: Onset of Labor With Vaginal Delivery      28 y.o. yo [id number] at [redacted]w[redacted]d was admitted in Active Labor on 01/29/2023. Labor course was complicated by nothing.   Membrane Rupture Time/Date: 7:00 AM,01/29/2023  Delivery Method:VBAC, Spontaneous Operative Delivery:N/A Episiotomy: None Lacerations: Deep  2nd degree;Perineal laceration with significant oozing.  Patient had a postpartum course complicated by symptomatic anemia (dizzy, weak) on evening of PPD#0; she was recommended to have a transfusion of 2u PRBC due to significant EBL of 930cc from lac oozing during repair process and admission Hgb of 8.0, however she declined. Hgb was 7.4 on PPD#1 and she was asymptomatic.  She is ambulating, tolerating a regular diet, passing flatus, and urinating well. Patient is discharged home in stable condition on 01/31/23.  Newborn Data: Birth date:01/29/2023 Birth time:10:43 AM Gender:Female Living status:Living Apgars:8 ,9  Weight:3410 g (7lb 8.3oz)  Magnesium  Sulfate received: No BMZ received: No Rhophylac:N/A MMR:N/A T-DaP: offered postpartum Flu: No RSV Vaccine received: No Transfusion:No- declined  Immunizations received: Immunization  History  Administered Date(s) Administered   HPV 9-valent 11/18/2014, 05/26/2015   Hepatitis A 11/18/2014, 05/26/2015   Hepatitis B 06/03/2014, 11/18/2014, 01/27/2015   Hpv-Unspecified 01/27/2015   Tdap 04/17/2021    Physical exam  Vitals:   01/30/23 0310 01/30/23 1337 01/30/23 2030 01/31/23 0525  BP: (!) 94/57 100/64 109/62 (!) 99/58  Pulse: 82 65 (!) 59 72  Resp: 18 18 17 18   Temp: 98.5 F (36.9 C) 98 F (36.7 C) 98.3 F (36.8 C) 98 F (36.7 C)  TempSrc: Oral Oral Oral Oral  SpO2: 100% 100% 100% 97%  Height:       General: alert and cooperative Lochia: appropriate Uterine Fundus: firm Incision: N/A DVT Evaluation: No evidence of DVT seen on physical exam. Labs: Lab Results  Component Value Date   WBC 14.1 (H) 01/30/2023   HGB 7.4 (L) 01/30/2023   HCT 24.9 (L) 01/30/2023   MCV 74.1 (L) 01/30/2023   PLT 217 01/30/2023      Latest Ref Rng & Units 01/29/2023    7:20 AM  CMP  Glucose 70 - 99 mg/dL 898   BUN 6 - 20 mg/dL 10   Creatinine 9.55 - 1.00 mg/dL 9.45   Sodium 864 - 854 mmol/L 131   Potassium 3.5 - 5.1 mmol/L 3.3   Chloride 98 - 111 mmol/L 102   CO2 22 - 32 mmol/L 19   Calcium 8.9 - 10.3 mg/dL 8.7   Total Protein 6.5 - 8.1 g/dL 7.1   Total Bilirubin 0.0 - 1.2 mg/dL 0.5   Alkaline Phos 38 -  126 U/L 212   AST 15 - 41 U/L 26   ALT 0 - 44 U/L 13    Edinburgh Score:    01/29/2023    1:40 PM  Edinburgh Postnatal Depression Scale Screening Tool  I have been able to laugh and see the funny side of things. 0  I have looked forward with enjoyment to things. 0  I have blamed myself unnecessarily when things went wrong. 1  I have been anxious or worried for no good reason. 0  I have felt scared or panicky for no good reason. 0  Things have been getting on top of me. 3  I have been so unhappy that I have had difficulty sleeping. 1  I have felt sad or miserable. 3  I have been so unhappy that I have been crying. 0  The thought of harming myself has occurred to  me. 0  Edinburgh Postnatal Depression Scale Total 8   No data recorded   After visit meds:  Allergies as of 01/31/2023   No Known Allergies      Medication List     STOP taking these medications    acetaminophen  500 MG tablet Commonly known as: TYLENOL    ondansetron  4 MG tablet Commonly known as: ZOFRAN        TAKE these medications    ferrous sulfate  325 (65 FE) MG tablet Take 1 tablet (325 mg total) by mouth every other day. What changed: when to take this   ibuprofen  600 MG tablet Commonly known as: ADVIL  Take 1 tablet (600 mg total) by mouth every 6 (six) hours as needed. What changed:  when to take this reasons to take this   polyethylene glycol powder 17 GM/SCOOP powder Commonly known as: GLYCOLAX /MIRALAX  Take 17 g by mouth daily as needed. What changed:  when to take this reasons to take this   Prenatal Vitamin 27-0.8 MG Tabs Take 1 tablet by mouth daily at 6 (six) AM.         Discharge home in stable condition Infant Feeding: Bottle and Breast Infant Disposition:home with mother Discharge instruction: per After Visit Summary and Postpartum booklet. Activity: Advance as tolerated. Pelvic rest for 6 weeks.  Diet: routine diet Future Appointments:No future appointments. Follow up Visit:  Follow-up Information     Center for [facility] at [facility]. Schedule an appointment as soon as possible for a visit in 4 week(s).   Specialty: Obstetrics and Gynecology Why: For your postpartum appointment Contact information: [address] [place]  [address] [phone]                 Please schedule this patient for a In person postpartum visit in 4 weeks with the following provider: Any provider. Additional Postpartum F/U: None   Low risk pregnancy complicated by:  nothing. No prenatal care Delivery mode:  VBAC, Spontaneous Anticipated Birth Control:  Unsure   01/31/2023 Suzen JONETTA Gentry,  CNM 9:08 AM

## 2023-01-29 NOTE — Progress Notes (Signed)
 Patient requests to wait until her husband is back at the bedside to discuss blood consent and transfusion. Patient has no complaints at this time and states she is asymptomatic. Patient knows to call out for RN once she and her husband discusses this.

## 2023-01-29 NOTE — Progress Notes (Signed)
 G2P1 at 39 1/7 weeks reports to [facility] with c/o labor and SROM.  SVE 5/vertex/clear fluid noted.  Only 2 PNV on record.  Dates done at MAU.  HX of C/S due to big baby per patient.  Using [employer].    Dr MARLA Carolin updated on patient status and orders to transfer to [facility] L+D 218.  Care [facility] updated.  Clotilda Beath RNC-OB  RROB [phone]

## 2023-01-29 NOTE — ED Provider Notes (Signed)
 [place] EMERGENCY DEPARTMENT AT [facility] Provider Note   CSN: [id number] Arrival date & time: 01/29/23  0715     History  No chief complaint on file.   Rachel Simon is a 28 y.o. female.  HPI 28 year old G2 P1 reported at [redacted] weeks gestation presents today with labor.  Initial history obtained through husband as patient speaks Swahili and we are trying to get an interpreter at this time.  He reports that patient has had previous pregnancy that was C-section.  They moved here from [place] have not received regular medical care.  He reports he thinks she got shots for her blood type before he reports that she started having labor yesterday morning and thinks that her water broke on the way here.     Home Medications Prior to Admission medications   Medication Sig Start Date End Date Taking? Authorizing Provider  acetaminophen  (TYLENOL ) 500 MG tablet Take 2 tablets (1,000 mg total) by mouth every 6 (six) hours. 06/19/21   Tanda Edsel Fuller, CNM  ferrous sulfate  325 (65 FE) MG tablet Take 1 tablet (325 mg total) by mouth 2 (two) times daily with a meal. 06/19/21   Wilson, Edsel Fuller, CNM  ibuprofen  (ADVIL ) 600 MG tablet Take 1 tablet (600 mg total) by mouth every 6 (six) hours. 06/19/21   Tanda Edsel Fuller, CNM  ondansetron  (ZOFRAN ) 4 MG tablet Take 1 tablet (4 mg total) by mouth every 6 (six) hours as needed for nausea or vomiting. 05/10/22   Jerral Meth, MD  polyethylene glycol powder (GLYCOLAX /MIRALAX ) 17 GM/SCOOP powder Take 17 g by mouth daily. 08/14/21   Dorothyann Drivers, MD  Prenatal Vit-Fe Fumarate-FA (PRENATAL VITAMIN) 27-0.8 MG TABS Take 1 tablet by mouth daily at 6 (six) AM. 02/23/21   Eldonna Suzen Octave, MD      Allergies    Patient has no known allergies.    Review of Systems   Review of Systems  Physical Exam Updated Vital Signs BP 122/67   Pulse 64   Temp 98.6 F (37 C) (Oral)   Resp 16   LMP 04/30/2022   SpO2 100%   Physical Exam Vitals reviewed.  HENT:     Head: Normocephalic.     Right Ear: External ear normal.     Left Ear: External ear normal.     Nose: Nose normal.     Mouth/Throat:     Pharynx: Oropharynx is clear.  Eyes:     Pupils: Pupils are equal, round, and reactive to light.  Cardiovascular:     Rate and Rhythm: Normal rate and regular rhythm.     Pulses: Normal pulses.  Pulmonary:     Effort: Pulmonary effort is normal.     Breath sounds: Normal breath sounds.  Abdominal:     Comments: Uterus gravid consistent with dates  Musculoskeletal:        General: Normal range of motion.     Cervical back: Normal range of motion.  Skin:    General: Skin is warm and dry.     Capillary Refill: Capillary refill takes less than 2 seconds.  Neurological:     General: No focal deficit present.     Mental Status: She is alert.  Psychiatric:        Mood and Affect: Mood normal.     ED Results / Procedures / Treatments   Labs (all labs ordered are listed, but only abnormal results are displayed) Labs Reviewed - No data to display  EKG None  Radiology No results found.  Procedures Procedures    Medications Ordered in ED Medications - No data to display  ED Course/ Medical Decision Making/ A&P                                 Medical Decision Making Amount and/or Complexity of Data Reviewed Labs: ordered.   28 year old female G2 P1 appears to be in active labor Patient being placed on toco and fetal monitoring Rapid OB and route Rh and labs ordered LR infusing OB rapid response nurse at bedside and on phone x 3 Repeat evaluation reveals patient has been sent to L and D per OB rapid response       Final Clinical Impression(s) / ED Diagnoses Final diagnoses:  None    Rx / DC Orders ED Discharge Orders     None         Levander Houston, MD 01/29/23 1159

## 2023-01-29 NOTE — H&P (Signed)
 HPI: Rachel Simon is a 28 y.o. year old G7P1001 female at [redacted]w[redacted]d weeks gestation by LMP, C/W 28 week US  who presents to [facility]  reporting Spontaneous rupture of membranes at 0700 and Labor. Was transferred to [facility] for care. Hx C/S x 1 for AOD. Baby was 4900 gm. No prenatal care. Was seen in ED and MAU. Had dating US  that aligned with EDD by LMP.    OB History     Gravida  2   Para  1   Term  1   Preterm      AB      Living  1      SAB      IAB      Ectopic      Multiple  0   Live Births  1          Past Medical History:  Diagnosis Date   Anemia 02/23/2021   Heart murmur 02/23/2021   IDA (iron  deficiency anemia)    Past Surgical History:  Procedure Laterality Date   CESAREAN SECTION  06/17/2021   Procedure: CESAREAN SECTION;  Surgeon: Rachel Keen, MD;  Location: [facility] ORS;  Service: Obstetrics;;   Family History: family history includes GI problems in her father. Social History:  reports that she has never smoked. She has never been exposed to tobacco smoke. She has never used smokeless tobacco. She reports that she does not drink alcohol and does not use drugs.     Maternal Diabetes: No Genetic Screening: Declined Maternal Ultrasounds/Referrals: Normal Fetal Ultrasounds or other Referrals:  None Maternal Substance Abuse:  No Significant Maternal Medications:  None Significant Maternal Lab Results:  None Number of Prenatal Visits:Less than or equal to 3 verified prenatal visits Maternal Vaccinations: None Other Comments:   No prenatal care. GBS unknown. No GTT.   Review of Systems Maternal Medical History:  Reason for admission: Rupture of membranes and contractions.   Contractions: Frequency: regular.   Perceived severity is strong.   Fetal activity: Perceived fetal activity is normal.   Prenatal complications: No PIH or IUGR.     Dilation: 6 Effacement (%): 80 Station: 0 Exam by:: Rachel Simon Blood pressure 125/83, pulse 90,  temperature 98.6 F (37 C), temperature source Oral, resp. rate 14, height 5' (1.524 m), last menstrual period 04/30/2022, SpO2 100%, currently breastfeeding. Maternal Exam:  Uterine Assessment: Contraction strength is firm.  Contraction frequency is regular.  Abdomen: Patient reports no abdominal tenderness. Fetal presentation: vertex Introitus: Normal vulva. Normal vagina.  Pelvis: adequate for delivery.   Cervix: Cervix evaluated by digital exam.     Fetal Exam Fetal Monitor Review: Baseline rate: 140.  Variability: moderate (6-25 bpm).   Pattern: accelerations present and no decelerations.   Fetal State Assessment: Category I - tracings are normal.   Physical Exam Constitutional:      General: She is in acute distress.     Appearance: She is not ill-appearing.  Cardiovascular:     Rate and Rhythm: Normal rate.  Pulmonary:     Effort: Pulmonary effort is normal. No respiratory distress.  Abdominal:     Palpations: Abdomen is soft.     Tenderness: There is no abdominal tenderness.  Genitourinary:    General: Normal vulva.  Musculoskeletal:     Right lower leg: No edema.     Left lower leg: No edema.  Skin:    General: Skin is warm and dry.  Neurological:     Mental Status: She  is alert and oriented to person, place, and time.  Psychiatric:        Mood and Affect: Mood normal.     Prenatal labs: ABO, Rh: --/--/B POS Performed at [facility], [address], [place], [place] [address]  [phone]/07 0725) Antibody:   Rubella: 17.90 (10/14 0323) RPR: NON REACTIVE (10/14 0323)  HBsAg: NON REACTIVE (10/14 0323)  HIV: Non Reactive (10/14 0323)  GBS:   Unknown  Assessment: 1. Labor: Active 2. Fetal Wellbeing: Category I  3. Pain Control: Comfort measures 4. GBS: unknown  5. 39.1 week IUP 6. Prior C/S in 2023 for AOD of 4900 gm baby. 7. Speaks Swahili   Plan:  1. Admit to BS per consult with MD 2. Routine L&D orders 3. Analgesia/anesthesia  PRN  4. Consented for TOLAC by Dr. Zina 5. Swahili interpreter used   Rachel Simon  Rachel Simon 01/29/2023, 8:47 AM

## 2023-01-29 NOTE — ED Notes (Signed)
 [facility] contacted for stat pickup

## 2023-01-29 NOTE — ED Triage Notes (Signed)
 Patient arrives POV. Patient reports she is in active labor and her water broke. States it is her second pregnancy.

## 2023-01-29 NOTE — Progress Notes (Signed)
 In room 218. Monitors applied.

## 2023-01-30 LAB — CBC
HCT: 24.9 % — ABNORMAL LOW (ref 36.0–46.0)
Hemoglobin: 7.4 g/dL — ABNORMAL LOW (ref 12.0–15.0)
MCH: 22 pg — ABNORMAL LOW (ref 26.0–34.0)
MCHC: 29.7 g/dL — ABNORMAL LOW (ref 30.0–36.0)
MCV: 74.1 fL — ABNORMAL LOW (ref 80.0–100.0)
Platelets: 217 10*3/uL (ref 150–400)
RBC: 3.36 MIL/uL — ABNORMAL LOW (ref 3.87–5.11)
RDW: 17.9 % — ABNORMAL HIGH (ref 11.5–15.5)
WBC: 14.1 10*3/uL — ABNORMAL HIGH (ref 4.0–10.5)
nRBC: 0.1 % (ref 0.0–0.2)

## 2023-01-30 LAB — HEMOGLOBIN A1C
Hgb A1c MFr Bld: 5.4 % (ref 4.8–5.6)
Mean Plasma Glucose: 108 mg/dL

## 2023-01-30 NOTE — Progress Notes (Signed)
 POSTPARTUM PROGRESS NOTE  Post Partum Day #1  Subjective:  Rachel Simon is a 28 y.o. [id number] s/p VBAC at [redacted]w[redacted]d.  She reports she is doing well. No acute events overnight. She denies any problems with ambulating, voiding or po intake. Denies nausea or vomiting.  Pain is well controlled.  Lochia is light.  Objective: Blood pressure 100/64, pulse 65, temperature 98 F (36.7 C), temperature source Oral, resp. rate 18, height 5' (1.524 m), last menstrual period 04/30/2022, SpO2 100%, unknown if currently breastfeeding.  Physical Exam:  General: alert, cooperative and no distress Chest: no respiratory distress Heart:regular rate, distal pulses intact Abdomen: soft, nontender,  Uterine Fundus: firm, appropriately tender DVT Evaluation: No calf swelling or tenderness Extremities: no LE edema Skin: warm, dry  Recent Labs    01/29/23 1847 01/30/23 0508  HGB 7.9* 7.4*  HCT 26.2* 24.9*    Assessment/Plan: Rachel Simon is a 28 y.o. [id number] s/p VBAC at [redacted]w[redacted]d   PPD#1 - Doing well  Routine postpartum care Symptomatic anemia: received 2u pRBC yesterday. Hgb stable today and feeling better.  Contraception: declines Feeding: breast and bottle Dispo: Plan for discharge PPD#2.   LOS: 1 day    Donnice CHRISTELLA Carolus, MD/MPH Attending Family Medicine Physician, [facility] for [facility], [facility]  01/30/2023, 2:35 PM

## 2023-01-30 NOTE — Progress Notes (Signed)
 Patient is declining the blood transfusion at this time after discussing with her husband. She states she only felt dizzy earlier because of the room temperature being too hot. She has been asymptomatic since that one time occurrence. Patient is aware of her repeat CBC at 0500 and would like to wait for those results and re-discuss the need for a blood transfusion at that time. Dr. Jhonny notified of changes.

## 2023-01-30 NOTE — Social Work (Signed)
CSW acknowledged consult and completed a clinical assessment.  There are no barriers to d/c.  Clinical assessment notes will be entered at a later time.  Tymar Polyak, LCSWA Clinical Social Worker [phone]  

## 2023-01-31 LAB — RUBELLA SCREEN: Rubella: 16.1 {index} (ref >0.99)

## 2023-01-31 MED ORDER — POLYETHYLENE GLYCOL 3350 17 GM/SCOOP PO POWD: 17.0000 g | Freq: Every day | ORAL | 0 refills | Status: AC | PRN

## 2023-01-31 MED ORDER — IBUPROFEN 600 MG PO TABS: 600.0000 mg | ORAL_TABLET | Freq: Four times a day (QID) | ORAL | 0 refills | Status: AC | PRN

## 2023-01-31 MED ORDER — FERROUS SULFATE 325 (65 FE) MG PO TABS: 325.0000 mg | ORAL_TABLET | ORAL | Status: AC

## 2023-01-31 NOTE — Clinical Social Work Maternal (Signed)
 CLINICAL SOCIAL WORK MATERNAL/CHILD NOTE  Patient Details  Name: Rachel Simon MRN: [id number] Date of Birth: Mar 06, 1995  Date:  01/31/2023  Clinical Social Worker Initiating Note:  Brinlyn Cena Date/Time: Initiated:  01/30/23/1255     Child's Name:  Rachel Simon 01/29/2023   Biological Parents:  Mother, Father Rachel Simon 1995-07-12, Rachel Simon)   Need for Interpreter:  Swahili   Reason for Referral:  Late or No Prenatal Care     Address:  [address] [place] [address]    Phone number:  [phone] (home)     Additional phone number:   Household Members/Support Persons (HM/SP):   Household Member/Support Person 1   HM/SP Name Relationship DOB or Age  HM/SP -1 Rachel Simon FOB unknown  HM/SP -2        HM/SP -3        HM/SP -4        HM/SP -5        HM/SP -6        HM/SP -7        HM/SP -8          Natural Supports (not living in the home):      Professional Supports: None   Employment: Unemployed   Type of Work:     Education:  9 to 11 years   Homebound arranged: No  Surveyor, Quantity Resources:  [employer]   Other Resources:  [employer]   Cultural/Religious Considerations Which May Impact Care:    Strengths:  Ability to meet basic needs  , Compliance with medical plan     Psychotropic Medications:         Pediatrician:       Pediatrician List:   Radiographer, Therapeutic    [place]    [place] [facility]      Pediatrician Fax Number:    Risk Factors/Current Problems:  None   Cognitive State:  Able to Concentrate  , Alert     Mood/Affect:  Calm  , Comfortable     CSW Assessment: CSW received a consult for no prenatal care, CSW met with MOB to compete assessment and offer support. CSW entered the room, and observed MOB resting in bed holding the infant and FOB at bedside. CSW arranged [employer] language interpreter Sherrod [phone]. CSW introduced self, CSW role and reason for visit. MOB was agreeable to  visit and allowed FOB to remain in the room. CSW verified MOB address and phone number, MOB confirmed the information was correct. CSW inquired but any MH hx, MOB denied MH concerns or hx. CSW provided education regarding the baby blues period vs. perinatal mood disorders, discussed treatment and gave resources for mental health follow up if concerns arise.  CSW recommends self-evaluation during the postpartum time period using the New Mom Checklist from Postpartum Progress and encouraged MOB to contact a medical professional if symptoms are noted at any time. MOB identified  her spouse as her support.   CSW inquired about MOB lack of PNC, MOB reported she did not know she was supposed to be seen prior to delivery. CSW informed MOB of the hospital drug screen policy du to lack of prenatal care, MOB verbalized understanding. CSW informed MOB infants UDS and CDS would be collected MOB verbalized understanding and denied substance use.  CSW inquire about transportation, MOB reported they have  a vehicle for transportation.  CSW provided review of Sudden Infant Death Syndrome (  SIDS) precautions.  MOB reported she has a bassinet for the infant to  sleep but do not have a car seat. CSW informed MOB a car seat could be provided or 30 dollars, MOB was agreeable. CSW provided a Car seat for the infant. CSW identifies no further need for intervention and no barriers to discharge at this time.  CSW Plan/Description:  Hospital Drug Screen Policy Information, CSW Will Continue to Monitor Umbilical Cord Tissue Drug Screen Results and Make Report if Warranted, Psychosocial Support and Ongoing Assessment of Needs, No Further Intervention Required/No Barriers to Discharge, Sudden Infant Death Syndrome (SIDS) Education, Perinatal Mood and Anxiety Disorder (PMADs) Education    Eliazar CHRISTELLA Gave, LCSW 01/31/2023, 12:57 PM

## 2023-01-31 NOTE — Lactation Note (Signed)
 This note was copied from a baby's chart. Lactation Consultation Note  Patient Name: Rachel Simon Unijb'd Date: 01/31/2023 Age:26 hours Reason for consult: Follow-up assessment;Maternal discharge;Term  P2, 39 wks, @ 51 hrs of life. Mom anticipates discharge today. Highlighted expectations of infant feeding pattern, cluster feeding overnight to bring milk in, and milk coming in over the next days. Mom shared she used to have a hand pump- broke- provided mom a hand pump to take home, 24 mm fits mom with a little room to expand. Highlighted expectations of milk coming in, and risk of engorgement. Encouraged hand expression/pump, motrin , and ice packs for 10-20 minutes best treatment for overfull engorged/inflamed breast.  Re-enforced LC resources O/P available to mom, Milk Storage times highlighted for mom.  Maternal Data Does the patient have breastfeeding experience prior to this delivery?: Yes How long did the patient breastfeed?: 9 months for 1st baby  Feeding Mother's Current Feeding Choice: Breast Milk and Formula   Lactation Tools Discussed/Used Tools: Pump Breast pump type: Manual (Provided mom a hand pump for use @ home, 24mm flange fits mom well with a little room for her nipple to expand as milk comes in) Pump Education: Milk Storage Reason for Pumping: Ease off engorgement of breast  Interventions Interventions: Expressed milk;Hand pump;Education;LC Services brochure  Discharge Discharge Education: Engorgement and breast care Pump: Manual;Personal  Consult Status Consult Status: Complete Date: 01/31/23    [name] 01/31/2023, 2:32 PM

## 2023-02-02 LAB — BPAM RBC
Blood Product Expiration Date: 202501282359
Blood Product Expiration Date: 202501312359
Unit Type and Rh: 7300
Unit Type and Rh: 7300

## 2023-02-02 LAB — TYPE AND SCREEN
ABO/RH(D): B POS
Antibody Screen: NEGATIVE
Unit division: 0
Unit division: 0

## 2023-02-05 DIAGNOSIS — D62 Acute posthemorrhagic anemia: Secondary | ICD-10-CM | POA: Insufficient documentation

## 2023-02-09 ENCOUNTER — Telehealth (HOSPITAL_COMMUNITY): Payer: Self-pay

## 2023-02-09 NOTE — Telephone Encounter (Signed)
02/09/2023 1615  Name: Rachel Simon MRN: [id number] DOB: 1995-06-20  Reason for Call:  Transition of Care Hospital Discharge Call  Contact Status: Patient Contact Status: Message  Language assistant needed: Interpreter Mode: Telephonic Interpreter Interpreter Name: [phone] Interpreter Phone Number - If applicable: [phone]        Follow-Up Questions:    Inocente Salles Postnatal Depression Scale:  In the Past 7 Days:    PHQ2-9 Depression Scale:     Discharge Follow-up:    Post-discharge interventions: NA  Signature  Signe Colt

## 2023-08-09 IMAGING — DX DG CHEST 1V PORT
1 series · 1 of 1 positions shown · non-contrast
Comparison: None.

CLINICAL DATA: Intermittent dyspnea

EXAM:
PORTABLE CHEST 1 VIEW

[chest ap]
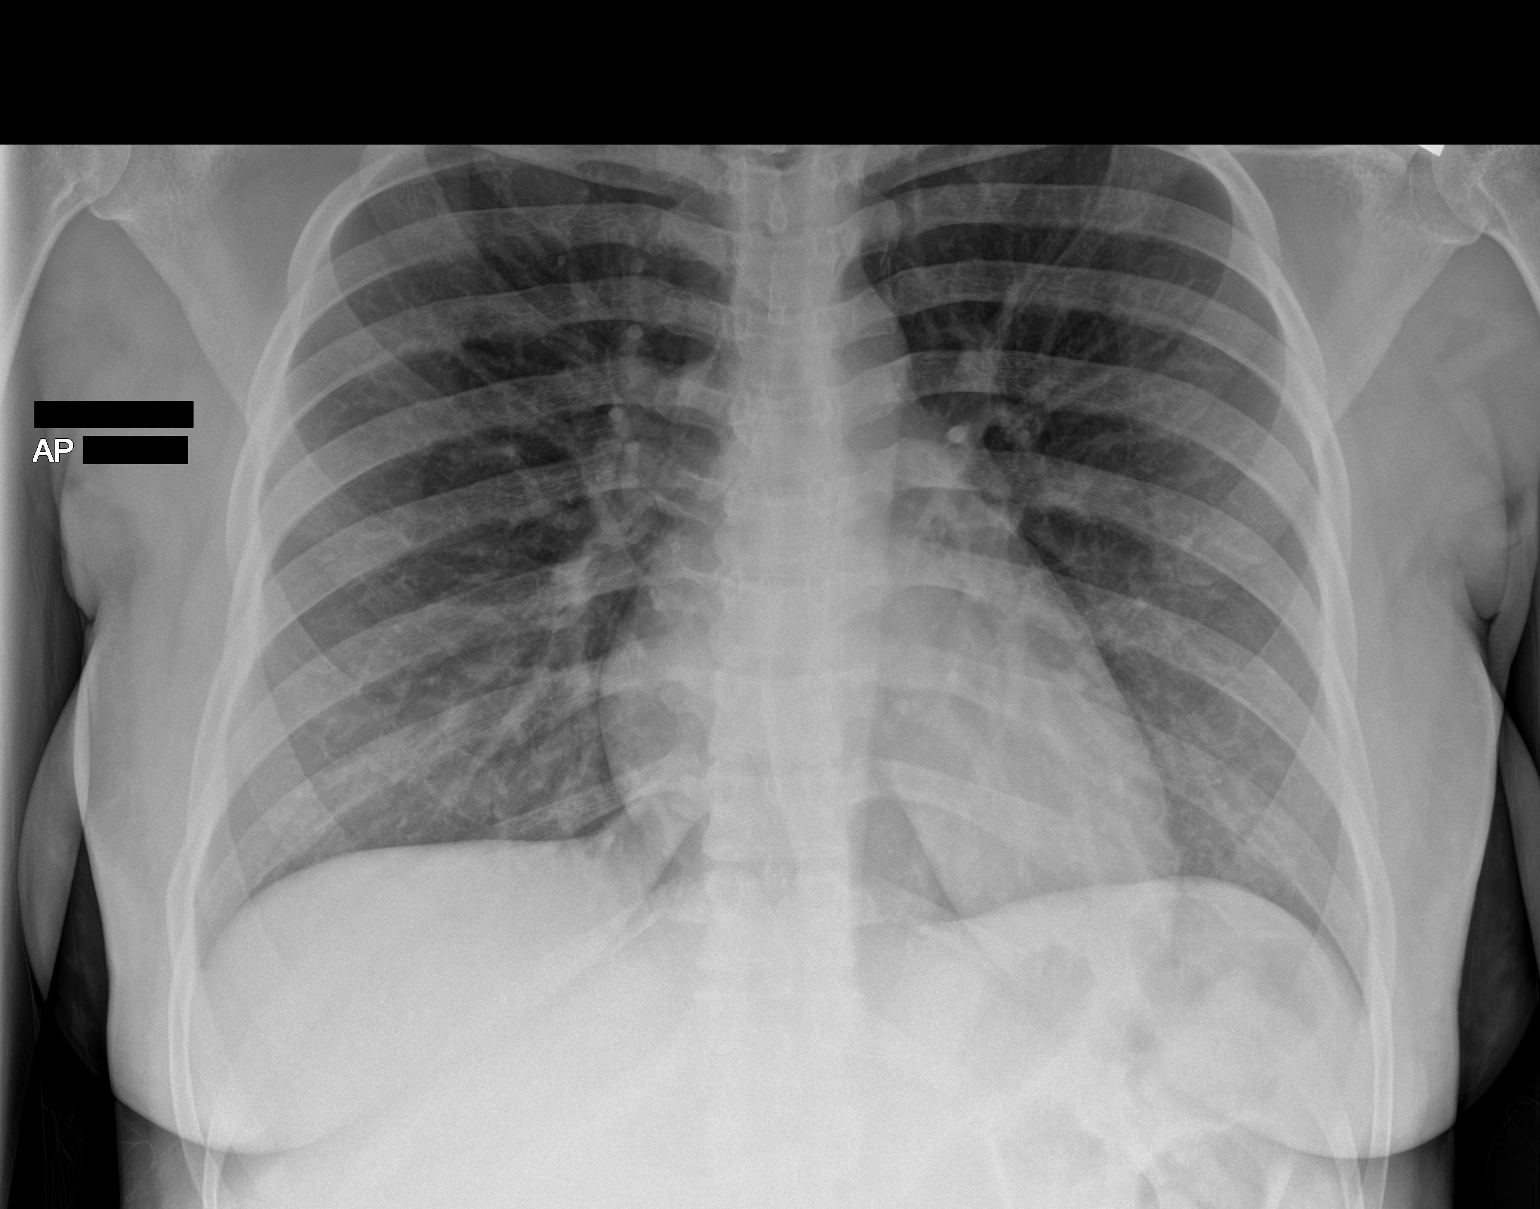

[1 of 1 positions shown; findings below may reference images not displayed]

FINDINGS: Normal heart size. Normal mediastinal contour. No pneumothorax. No
pleural effusion. Lungs appear clear, with no acute consolidative
airspace disease and no pulmonary edema.
IMPRESSION: No active disease.
